# Patient Record
Sex: Female | Born: 1985 | Race: White | Hispanic: No | Marital: Married | Smoking: Never smoker
Health system: Southern US, Community
[De-identification: ages and names within clinical notes are randomized; demographics above are authoritative.]

## PROBLEM LIST (undated history)

## (undated) ENCOUNTER — Inpatient Hospital Stay (HOSPITAL_COMMUNITY): Payer: Self-pay

## (undated) DIAGNOSIS — M1990 Unspecified osteoarthritis, unspecified site: Secondary | ICD-10-CM

## (undated) DIAGNOSIS — M419 Scoliosis, unspecified: Secondary | ICD-10-CM

## (undated) DIAGNOSIS — IMO0001 Reserved for inherently not codable concepts without codable children: Secondary | ICD-10-CM

## (undated) DIAGNOSIS — M4050 Lordosis, unspecified, site unspecified: Secondary | ICD-10-CM

## (undated) DIAGNOSIS — L709 Acne, unspecified: Secondary | ICD-10-CM

## (undated) HISTORY — DX: Scoliosis, unspecified: M41.9

## (undated) HISTORY — DX: Acne, unspecified: L70.9

## (undated) HISTORY — DX: Lordosis, unspecified, site unspecified: M40.50

## (undated) HISTORY — DX: Reserved for inherently not codable concepts without codable children: IMO0001

## (undated) HISTORY — PX: WISDOM TOOTH EXTRACTION: SHX21

---

## 2012-11-07 ENCOUNTER — Telehealth: Payer: Self-pay | Admitting: Physician Assistant

## 2012-11-07 DIAGNOSIS — Z789 Other specified health status: Secondary | ICD-10-CM

## 2012-11-07 DIAGNOSIS — L709 Acne, unspecified: Secondary | ICD-10-CM

## 2012-11-07 MED ORDER — NORGESTIMATE-ETH ESTRADIOL 0.25-35 MG-MCG PO TABS: 1.0000 | ORAL_TABLET | Freq: Every day | ORAL | Status: DC

## 2012-11-07 MED ORDER — MINOCYCLINE HCL 100 MG PO CAPS: 100.0000 mg | ORAL_CAPSULE | Freq: Two times a day (BID) | ORAL | Status: DC

## 2012-11-07 NOTE — Telephone Encounter (Signed)
Uses for acne.  Last refill 09/22/12  Minocycline 100mg  bid last ov for this 07/11/12 Sprintec birth control last refill 09/17/12  Last CPE w/pap 07/01/11 Minocycline refilled with additional refills Spintec refill for 2 months only.  Pt need to schedule CPE.  Pt called and appt made 11/30/12

## 2012-11-30 ENCOUNTER — Encounter: Payer: Self-pay | Admitting: Physician Assistant

## 2012-11-30 ENCOUNTER — Ambulatory Visit (INDEPENDENT_AMBULATORY_CARE_PROVIDER_SITE_OTHER): Payer: BC Managed Care – PPO | Admitting: Physician Assistant

## 2012-11-30 VITALS — BP 104/66 | HR 72 | Temp 98.8°F | Resp 18 | Ht 64.0 in | Wt 141.0 lb

## 2012-11-30 DIAGNOSIS — M4050 Lordosis, unspecified, site unspecified: Secondary | ICD-10-CM | POA: Insufficient documentation

## 2012-11-30 DIAGNOSIS — M419 Scoliosis, unspecified: Secondary | ICD-10-CM | POA: Insufficient documentation

## 2012-11-30 DIAGNOSIS — L708 Other acne: Secondary | ICD-10-CM

## 2012-11-30 DIAGNOSIS — Z0000 Encounter for general adult medical examination without abnormal findings: Secondary | ICD-10-CM

## 2012-11-30 DIAGNOSIS — Z789 Other specified health status: Secondary | ICD-10-CM

## 2012-11-30 DIAGNOSIS — L709 Acne, unspecified: Secondary | ICD-10-CM

## 2012-11-30 DIAGNOSIS — IMO0001 Reserved for inherently not codable concepts without codable children: Secondary | ICD-10-CM

## 2012-11-30 DIAGNOSIS — B373 Candidiasis of vulva and vagina: Secondary | ICD-10-CM

## 2012-11-30 DIAGNOSIS — Z309 Encounter for contraceptive management, unspecified: Secondary | ICD-10-CM

## 2012-11-30 MED ORDER — NORGESTIMATE-ETH ESTRADIOL 0.25-35 MG-MCG PO TABS: 1.0000 | ORAL_TABLET | Freq: Every day | ORAL | Status: DC

## 2012-11-30 MED ORDER — FLUCONAZOLE 150 MG PO TABS: 150.0000 mg | ORAL_TABLET | Freq: Once | ORAL | Status: DC

## 2012-11-30 MED ORDER — MINOCYCLINE HCL 100 MG PO CAPS: 100.0000 mg | ORAL_CAPSULE | Freq: Two times a day (BID) | ORAL | Status: DC
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
## 2013-01-20 ENCOUNTER — Ambulatory Visit
Admission: RE | Admit: 2013-01-20 | Discharge: 2013-01-20 | Disposition: A | Discharge status: Home / Self Care | Payer: BC Managed Care – PPO | Source: Ambulatory Visit | Attending: Physician Assistant | Admitting: Physician Assistant

## 2013-01-20 DIAGNOSIS — Z0000 Encounter for general adult medical examination without abnormal findings: Secondary | ICD-10-CM

## 2013-03-06 ENCOUNTER — Telehealth: Payer: Self-pay | Admitting: Physician Assistant

## 2013-03-06 NOTE — Telephone Encounter (Signed)
Spoke to patient about OCT and antibiotics.  There is a risk of OCT being less effective when on antibiotics.  What the percentages are variable between each individual patient and the meds they are on.  Would suggest to be safe use another form of protection in conjunction with medications.  Offered topical acne treatment per provider recommendation.  She want to discuss with husband and she will let us know.

## 2013-03-06 NOTE — Telephone Encounter (Signed)
Pt is on sprintec and minocycline. When she went to get them at the pharmacy the other day they told her that they interact with each other. They said that the minocycline can make the sprintec useless. She wants to know if this is true. If so, can she get another acne medication. She also wants to know that if this is true could she have fertility problems because she has been married since April and has not gotten pregnant since then.

## 2013-03-06 NOTE — Telephone Encounter (Signed)
Antibiotics can decrease the effectiess of OCT but she should not be concerned about infertility problems just because she did not get pregnant while taking both OCT and Abx.  For the acne, can stop the minocycline and use topical med only. Rx BenzaClinGel Apply QHS 30 gram/2 Refill

## 2013-11-15 ENCOUNTER — Telehealth: Payer: Self-pay | Admitting: Physician Assistant

## 2013-11-15 ENCOUNTER — Encounter: Payer: Self-pay | Admitting: Family Medicine

## 2013-11-15 DIAGNOSIS — Z789 Other specified health status: Secondary | ICD-10-CM

## 2013-11-15 MED ORDER — NORGESTIMATE-ETH ESTRADIOL 0.25-35 MG-MCG PO TABS: 1.0000 | ORAL_TABLET | Freq: Every day | ORAL | Status: DC

## 2013-11-15 NOTE — Telephone Encounter (Signed)
Medication refill for one time only.  Patient needs to be seen.  Letter sent for patient to call and schedule 

## 2013-11-15 NOTE — Telephone Encounter (Signed)
[phone] Pharmacy [facility] [address] [place] Pt is needing a refill on her birth control could not understand name of medication

## 2013-11-28 ENCOUNTER — Other Ambulatory Visit: Payer: Self-pay | Admitting: Physician Assistant

## 2013-11-28 ENCOUNTER — Encounter: Payer: Self-pay | Admitting: Family Medicine

## 2013-11-28 DIAGNOSIS — Z309 Encounter for contraceptive management, unspecified: Secondary | ICD-10-CM

## 2013-11-28 NOTE — Telephone Encounter (Signed)
Medication refill for one time only.  Patient needs to be seen.  Letter sent for patient to call and schedule 

## 2013-12-01 ENCOUNTER — Other Ambulatory Visit: Payer: Self-pay | Admitting: Physician Assistant

## 2013-12-01 NOTE — Telephone Encounter (Signed)
Medication filled x1 with no refills.   Requires office visit before any further refills can be given.  

## 2013-12-21 ENCOUNTER — Other Ambulatory Visit: Payer: Self-pay

## 2013-12-21 ENCOUNTER — Other Ambulatory Visit: Payer: Self-pay | Admitting: Adult Health Nurse Practitioner

## 2013-12-21 DIAGNOSIS — Z1231 Encounter for screening mammogram for malignant neoplasm of breast: Secondary | ICD-10-CM

## 2013-12-21 DIAGNOSIS — Z803 Family history of malignant neoplasm of breast: Secondary | ICD-10-CM

## 2014-01-01 ENCOUNTER — Other Ambulatory Visit: Payer: Self-pay | Admitting: Physician Assistant

## 2014-01-01 ENCOUNTER — Encounter: Payer: Self-pay | Admitting: Family Medicine

## 2014-01-01 NOTE — Telephone Encounter (Signed)
Medication refill for one time only.  Patient needs to be seen.  Letter sent for patient to call and schedule 

## 2014-01-08 ENCOUNTER — Other Ambulatory Visit: Payer: Self-pay | Admitting: Physician Assistant

## 2014-01-08 NOTE — Telephone Encounter (Signed)
Medication filled x1 with no refills.   Requires office visit before any further refills can be given.   Letter sent.  

## 2014-01-23 ENCOUNTER — Ambulatory Visit
Admission: RE | Admit: 2014-01-23 | Discharge: 2014-01-23 | Disposition: A | Discharge status: Home / Self Care | Payer: BC Managed Care – PPO | Source: Ambulatory Visit | Attending: Adult Health Nurse Practitioner | Admitting: Adult Health Nurse Practitioner

## 2014-01-23 ENCOUNTER — Encounter (INDEPENDENT_AMBULATORY_CARE_PROVIDER_SITE_OTHER): Payer: Self-pay

## 2014-01-23 DIAGNOSIS — Z1231 Encounter for screening mammogram for malignant neoplasm of breast: Secondary | ICD-10-CM

## 2014-01-23 DIAGNOSIS — Z803 Family history of malignant neoplasm of breast: Secondary | ICD-10-CM

## 2014-01-24 ENCOUNTER — Other Ambulatory Visit: Payer: Self-pay | Admitting: Physician Assistant

## 2014-01-24 NOTE — Telephone Encounter (Signed)
Medication filled x1 with no refills.   Requires office visit before any further refills can be given.   Letter sent.  

## 2015-05-06 ENCOUNTER — Other Ambulatory Visit: Payer: Self-pay

## 2015-05-06 DIAGNOSIS — Z1231 Encounter for screening mammogram for malignant neoplasm of breast: Secondary | ICD-10-CM

## 2015-05-14 ENCOUNTER — Telehealth (HOSPITAL_COMMUNITY): Payer: Self-pay | Admitting: *Deleted

## 2015-06-17 ENCOUNTER — Ambulatory Visit: Payer: Self-pay

## 2015-06-27 ENCOUNTER — Ambulatory Visit: Payer: Self-pay

## 2015-07-17 ENCOUNTER — Ambulatory Visit
Admission: RE | Admit: 2015-07-17 | Discharge: 2015-07-17 | Disposition: A | Discharge status: Home / Self Care | Payer: BLUE CROSS/BLUE SHIELD | Source: Ambulatory Visit

## 2015-07-17 DIAGNOSIS — Z1231 Encounter for screening mammogram for malignant neoplasm of breast: Secondary | ICD-10-CM

## 2016-01-06 ENCOUNTER — Inpatient Hospital Stay (HOSPITAL_COMMUNITY)
Admission: AD | Admit: 2016-01-06 | Discharge: 2016-01-06 | Disposition: A | Discharge status: Home / Self Care | Payer: BLUE CROSS/BLUE SHIELD | Source: Ambulatory Visit | Attending: Obstetrics and Gynecology | Admitting: Obstetrics and Gynecology

## 2016-01-06 ENCOUNTER — Inpatient Hospital Stay (HOSPITAL_COMMUNITY): Payer: BLUE CROSS/BLUE SHIELD

## 2016-01-06 ENCOUNTER — Encounter (HOSPITAL_COMMUNITY): Payer: Self-pay | Admitting: Student

## 2016-01-06 DIAGNOSIS — O200 Threatened abortion: Secondary | ICD-10-CM | POA: Insufficient documentation

## 2016-01-06 DIAGNOSIS — O209 Hemorrhage in early pregnancy, unspecified: Secondary | ICD-10-CM

## 2016-01-06 DIAGNOSIS — Z882 Allergy status to sulfonamides status: Secondary | ICD-10-CM | POA: Insufficient documentation

## 2016-01-06 DIAGNOSIS — Z3A00 Weeks of gestation of pregnancy not specified: Secondary | ICD-10-CM | POA: Diagnosis not present

## 2016-01-06 DIAGNOSIS — M419 Scoliosis, unspecified: Secondary | ICD-10-CM | POA: Diagnosis not present

## 2016-01-06 DIAGNOSIS — O3680X0 Pregnancy with inconclusive fetal viability, not applicable or unspecified: Secondary | ICD-10-CM

## 2016-01-06 HISTORY — DX: Unspecified osteoarthritis, unspecified site: M19.90

## 2016-01-06 LAB — URINE MICROSCOPIC-ADD ON: WBC UA: NONE SEEN WBC/hpf (ref 0–5)

## 2016-01-06 LAB — WET PREP, GENITAL
Clue Cells Wet Prep HPF POC: NONE SEEN
Sperm: NONE SEEN
Trich, Wet Prep: NONE SEEN
Yeast Wet Prep HPF POC: NONE SEEN

## 2016-01-06 LAB — URINALYSIS, ROUTINE W REFLEX MICROSCOPIC
Bilirubin Urine: NEGATIVE
GLUCOSE, UA: NEGATIVE mg/dL
KETONES UR: NEGATIVE mg/dL
LEUKOCYTES UA: NEGATIVE
Nitrite: NEGATIVE
PROTEIN: NEGATIVE mg/dL
Specific Gravity, Urine: <1.005 — ABNORMAL LOW (ref 1.005–1.030)
pH: 6 (ref 5.0–8.0)

## 2016-01-06 LAB — ABO/RH: ABO/RH(D): A POS

## 2016-01-06 LAB — CBC
HCT: 40.1 % (ref 36.0–46.0)
Hemoglobin: 13.9 g/dL (ref 12.0–15.0)
MCH: 30 pg (ref 26.0–34.0)
MCHC: 34.7 g/dL (ref 30.0–36.0)
MCV: 86.6 fL (ref 78.0–100.0)
PLATELETS: 241 10*3/uL (ref 150–400)
RBC: 4.63 MIL/uL (ref 3.87–5.11)
RDW: 13.4 % (ref 11.5–15.5)
WBC: 9.6 10*3/uL (ref 4.0–10.5)

## 2016-01-06 LAB — HCG, QUANTITATIVE, PREGNANCY: hCG, Beta Chain, Quant, S: 228 m[IU]/mL — ABNORMAL HIGH (ref <5)

## 2016-01-06 LAB — POCT PREGNANCY, URINE: Preg Test, Ur: POSITIVE — AB

## 2016-01-06 NOTE — MAU Note (Signed)
Pt states she spotted on Saturday, was ok on Sunday.  Began bleeding today before noon, has become heavier.  Passed a clot of ? Tissue around 1730.  Bleeding has been less since then.  Has also had lower abd cramping.  Pos HPT on June 4.

## 2016-01-06 NOTE — MAU Provider Note (Signed)
History     CSN: [id number]  Arrival date and time: 01/06/16 [id number]   First Provider Initiated Contact with Patient 01/06/16 2013      Chief Complaint  Patient presents with  . Vaginal Bleeding  . Abdominal Pain   Vaginal Bleeding The patient's primary symptoms include pelvic pain and vaginal bleeding. This is a new problem. The current episode started yesterday. The problem occurs intermittently. The problem has been gradually worsening. Pain severity now: 4/10  The problem affects both sides. She is pregnant. Associated symptoms include abdominal pain. Pertinent negatives include no chills, constipation, diarrhea, dysuria, fever, frequency, nausea, urgency or vomiting. The vaginal bleeding is typical of menses. She has been passing clots (one golf ball sized clot. ). She has been passing tissue. Nothing aggravates the symptoms. She has tried nothing for the symptoms. Menstrual history: LMP 11/17/15     Past Medical History  Diagnosis Date  . Acne   . Contraception   . Scoliosis   . Lordosis   . Arthritis     Past Surgical History  Procedure Laterality Date  . Wisdom tooth extraction      Family History  Problem Relation Age of Onset  . Cancer Mother 2638    breast cancer  . Cancer Father 6160    stomach,liver cancer  . Heart disease Father 250    CAD    Social History  Substance Use Topics  . Smoking status: Never Smoker   . Smokeless tobacco: Never Used  . Alcohol Use: No    Allergies:  Allergies  Allergen Reactions  . Sulfa Antibiotics Rash    Prescriptions prior to admission  Medication Sig Dispense Refill Last Dose  . Azelaic Acid (FINACEA EX) Apply 1 application topically 2 (two) times daily.   01/06/2016 at Unknown time  . Prenatal Vit-Fe Fumarate-FA (PRENATAL MULTIVITAMIN) TABS tablet Take 1 tablet by mouth daily at 12 noon.   01/06/2016 at Unknown time  . fluconazole (DIFLUCAN) 150 MG tablet Take 1 tablet (150 mg total) by mouth once. (Patient not taking:  Reported on 01/06/2016) 1 tablet 0   . minocycline (MINOCIN,DYNACIN) 100 MG capsule TAKE 1 CAPSULE (100 MG TOTAL) BY MOUTH 2 (TWO) TIMES DAILY. (Patient not taking: Reported on 01/06/2016) 60 capsule 0   . SPRINTEC 28 0.25-35 MG-MCG tablet TAKE 1 TABLET BY MOUTH DAILY. (Patient not taking: Reported on 01/06/2016) 28 tablet 0     Review of Systems  Constitutional: Negative for fever and chills.  Gastrointestinal: Positive for abdominal pain. Negative for nausea, vomiting, diarrhea and constipation.  Genitourinary: Positive for vaginal bleeding and pelvic pain. Negative for dysuria, urgency and frequency.   Physical Exam   Blood pressure 124/81, pulse 87, temperature 98.8 F (37.1 C), temperature source Oral, resp. rate 16, height 5\' 4"  (1.626 m), weight 65.318 kg (144 lb), last menstrual period 11/17/2015.  Physical Exam  Nursing note and vitals reviewed. Constitutional: She is oriented to person, place, and time. She appears well-developed and well-nourished. No distress.  HENT:  Head: Normocephalic.  Cardiovascular: Normal rate.   Respiratory: Effort normal.  GI: Soft. There is no tenderness. There is no rebound.  Genitourinary:   External: no lesion Vagina: small amount of dark red blood  Cervix: pink, smooth Uterus: NSSC Adnexa: NT   Neurological: She is alert and oriented to person, place, and time.  Skin: Skin is warm and dry.  Psychiatric: She has a normal mood and affect.     Results for orders placed or  performed during the hospital encounter of 01/06/16 (from the past 24 hour(s))  Urinalysis, Routine w reflex microscopic (not at [facility])     Status: Abnormal   Collection Time: 01/06/16  6:28 PM  Result Value Ref Range   Color, Urine YELLOW YELLOW   APPearance CLEAR CLEAR   Specific Gravity, Urine <1.005 (L) 1.005 - 1.030   pH 6.0 5.0 - 8.0   Glucose, UA NEGATIVE NEGATIVE mg/dL   Hgb urine dipstick LARGE (A) NEGATIVE   Bilirubin Urine NEGATIVE NEGATIVE   Ketones, ur  NEGATIVE NEGATIVE mg/dL   Protein, ur NEGATIVE NEGATIVE mg/dL   Nitrite NEGATIVE NEGATIVE   Leukocytes, UA NEGATIVE NEGATIVE  Urine microscopic-add on     Status: Abnormal   Collection Time: 01/06/16  6:28 PM  Result Value Ref Range   Squamous Epithelial / LPF 0-5 (A) NONE SEEN   WBC, UA NONE SEEN 0 - 5 WBC/hpf   RBC / HPF 6-30 0 - 5 RBC/hpf   Bacteria, UA RARE (A) NONE SEEN  Pregnancy, urine POC     Status: Abnormal   Collection Time: 01/06/16  6:35 PM  Result Value Ref Range   Preg Test, Ur POSITIVE (A) NEGATIVE  CBC     Status: None   Collection Time: 01/06/16  7:17 PM  Result Value Ref Range   WBC 9.6 4.0 - 10.5 K/uL   RBC 4.63 3.87 - 5.11 MIL/uL   Hemoglobin 13.9 12.0 - 15.0 g/dL   HCT 74.040.1 81.436.0 - 48.146.0 %   MCV 86.6 78.0 - 100.0 fL   MCH 30.0 26.0 - 34.0 pg   MCHC 34.7 30.0 - 36.0 g/dL   RDW 85.613.4 31.411.5 - 97.015.5 %   Platelets 241 150 - 400 K/uL  ABO/Rh     Status: None (Preliminary result)   Collection Time: 01/06/16  7:18 PM  Result Value Ref Range   ABO/RH(D) A POS   hCG, quantitative, pregnancy     Status: Abnormal   Collection Time: 01/06/16  7:18 PM  Result Value Ref Range   hCG, Beta Chain, Quant, S 228 (H) <5 mIU/mL  Wet prep, genital     Status: Abnormal   Collection Time: 01/06/16  8:20 PM  Result Value Ref Range   Yeast Wet Prep HPF POC NONE SEEN NONE SEEN   Trich, Wet Prep NONE SEEN NONE SEEN   Clue Cells Wet Prep HPF POC NONE SEEN NONE SEEN   WBC, Wet Prep HPF POC FEW (A) NONE SEEN   Sperm NONE SEEN    [place] Ob Comp Less 14 Wks  01/06/2016  CLINICAL DATA:  30 year old female with positive HCG levels and vaginal bleeding EXAM: OBSTETRIC <14 WK US AND TRANSVAGINAL OB US TECHNIQUE: Both transabdominal and transvaginal ultrasound examinations were performed for complete evaluation of the gestation as well as the maternal uterus, adnexal regions, and pelvic cul-de-sac. Transvaginal technique was performed to assess early pregnancy. COMPARISON:  None. FINDINGS: The  uterus is anteverted and appears homogeneous. The endometrium measures 6 mm in thickness. No intrauterine pregnancy identified. Findings may represent an early pregnancy with too small gestational sac to visualize, spontaneous miscarriage. The possibility of an ectopic pregnancy is not excluded. The right ovary measures 3.0 x 2.7 x 2.7 cm. There is a 2.0 x 2.3 x 2.1 cm cyst with a septation in the right ovary. The left ovary appears unremarkable and measures 2.2 x 1.4 x 1.1 cm. A corpus luteum noted in the left ovary. No free fluid within  pelvis. IMPRESSION: No intrauterine pregnancy identified. Please note with a positive HCG level and in the absence of documented intrauterine pregnancy the possibility of an ectopic pregnancy is not entirely excluded. Correlation with serial HCG levels and follow-up with ultrasound recommended. Electronically Signed   By: Elgie Collard M.D.   On: 01/06/2016 21:01   US Ob Transvaginal  01/06/2016  CLINICAL DATA:  30 year old female with positive HCG levels and vaginal bleeding EXAM: OBSTETRIC <14 WK [place] AND TRANSVAGINAL OB US TECHNIQUE: Both transabdominal and transvaginal ultrasound examinations were performed for complete evaluation of the gestation as well as the maternal uterus, adnexal regions, and pelvic cul-de-sac. Transvaginal technique was performed to assess early pregnancy. COMPARISON:  None. FINDINGS: The uterus is anteverted and appears homogeneous. The endometrium measures 6 mm in thickness. No intrauterine pregnancy identified. Findings may represent an early pregnancy with too small gestational sac to visualize, spontaneous miscarriage. The possibility of an ectopic pregnancy is not excluded. The right ovary measures 3.0 x 2.7 x 2.7 cm. There is a 2.0 x 2.3 x 2.1 cm cyst with a septation in the right ovary. The left ovary appears unremarkable and measures 2.2 x 1.4 x 1.1 cm. A corpus luteum noted in the left ovary. No free fluid within pelvis. IMPRESSION: No  intrauterine pregnancy identified. Please note with a positive HCG level and in the absence of documented intrauterine pregnancy the possibility of an ectopic pregnancy is not entirely excluded. Correlation with serial HCG levels and follow-up with ultrasound recommended. Electronically Signed   By: Elgie Collard M.D.   On: 01/06/2016 21:01    MAU Course  Procedures  MDM   Assessment and Plan   1. Threatened abortion in first trimester   2. Vaginal bleeding in pregnancy, first trimester   3. Pregnancy of unknown anatomic location    DC home Comfort measures reviewed  1st Trimester precautions  Bleeding precautions Ectopic precautions RX: none  Return to MAU as needed FU with OB as planned  Follow-up Information    Follow up with [facility].   Specialty:  Obstetrics and Gynecology   Why:  Thursday morning between 8-11 am    Contact information:   [address] [place] [address] [phone]        Tawnya Crook 01/06/2016, 8:15 PM

## 2016-01-06 NOTE — Discharge Instructions (Signed)

## 2016-01-07 LAB — GC/CHLAMYDIA PROBE AMP (~~LOC~~) NOT AT ARMC
Chlamydia: NEGATIVE
Neisseria Gonorrhea: NEGATIVE

## 2016-01-07 LAB — HIV ANTIBODY (ROUTINE TESTING W REFLEX): HIV Screen 4th Generation wRfx: NONREACTIVE

## 2016-01-08 ENCOUNTER — Telehealth: Payer: Self-pay | Admitting: *Deleted

## 2016-01-08 DIAGNOSIS — O039 Complete or unspecified spontaneous abortion without complication: Secondary | ICD-10-CM

## 2016-01-08 NOTE — Telephone Encounter (Signed)
Pt called because she has had an SAB and was [facility] and was to [facility] on Thursday for Qhcg.  She has been called out of town for a funeral and will not be back until Friday.  Lab requisition was taken to lab so that pt could go there around 3:00 Friday.  Results will be called to pt on Monday.

## 2016-01-11 LAB — HCG, QUANTITATIVE, PREGNANCY: HCG, BETA CHAIN, QUANT, S: 22.3 m[IU]/mL — AB

## 2016-01-15 ENCOUNTER — Other Ambulatory Visit (INDEPENDENT_AMBULATORY_CARE_PROVIDER_SITE_OTHER): Payer: BLUE CROSS/BLUE SHIELD

## 2016-01-15 DIAGNOSIS — O039 Complete or unspecified spontaneous abortion without complication: Secondary | ICD-10-CM | POA: Diagnosis not present

## 2016-01-16 ENCOUNTER — Telehealth: Payer: Self-pay | Admitting: *Deleted

## 2016-01-16 LAB — HCG, QUANTITATIVE, PREGNANCY: hCG, Beta Chain, Quant, S: 5.5 m[IU]/mL — ABNORMAL HIGH

## 2016-01-16 NOTE — Telephone Encounter (Signed)
-----   Message from Tereso NewcomerUgonna A Anyanwu, MD sent at 01/16/2016  9:29 AM EDT ----- Completed SAB, no further labs needed. Can make an appointment with any provider to discuss future pregnancy plans, contraception etc.  Please call to inform patient of results and recommendations.

## 2016-01-16 NOTE — Telephone Encounter (Signed)
Pt notified of BHCG results, she will call for a f/u appt if not pregnant in 3-4 months.

## 2016-01-17 ENCOUNTER — Encounter: Payer: BLUE CROSS/BLUE SHIELD | Admitting: Family

## 2016-03-14 ENCOUNTER — Inpatient Hospital Stay (HOSPITAL_COMMUNITY)
Admission: AD | Admit: 2016-03-14 | Payer: BC Managed Care – PPO | Source: Ambulatory Visit | Admitting: Obstetrics & Gynecology

## 2016-04-20 ENCOUNTER — Encounter: Payer: Self-pay | Admitting: Obstetrics & Gynecology

## 2016-04-20 ENCOUNTER — Ambulatory Visit (INDEPENDENT_AMBULATORY_CARE_PROVIDER_SITE_OTHER): Payer: BC Managed Care – PPO | Admitting: Obstetrics & Gynecology

## 2016-04-20 VITALS — Ht 65.0 in | Wt 142.0 lb

## 2016-04-20 DIAGNOSIS — Z124 Encounter for screening for malignant neoplasm of cervix: Secondary | ICD-10-CM | POA: Diagnosis not present

## 2016-04-20 DIAGNOSIS — Z1151 Encounter for screening for human papillomavirus (HPV): Secondary | ICD-10-CM | POA: Diagnosis not present

## 2016-04-20 DIAGNOSIS — Z803 Family history of malignant neoplasm of breast: Secondary | ICD-10-CM | POA: Insufficient documentation

## 2016-04-20 DIAGNOSIS — Z01419 Encounter for gynecological examination (general) (routine) without abnormal findings: Secondary | ICD-10-CM

## 2016-04-20 NOTE — Progress Notes (Signed)
Subjective:     Emily Mclaughlin is a 30 y.o. female here for a routine exam.  Current complaints: infertillity.  Pt has been trying for over a year and had one pregnancy that ended in miscarriage (see notes in MAU).  Never had a viable IUP.  Possible miscarried at home before coming to ED.  Has timed intercourse.  Uses ovulation prediction kits and ovulates.  Mensdes has become lighter--lsating 2 days.  Menstrual cycles are 26-28 days. .   Gynecologic History Patient's last menstrual period was 03/29/2016. Contraception: none Last Pap: ?2015. Results were: normal Last mammogram: uknown  Obstetric History OB History  Gravida Para Term Preterm AB Living  1            SAB TAB Ectopic Multiple Live Births               # Outcome Date GA Lbr Len/2nd Weight Sex Delivery Anes PTL Lv  1 Gravida                The following portions of the patient's history were reviewed and updated as appropriate: allergies, current medications, past family history, past medical history, past social history, past surgical history and problem list.  Review of Systems Pertinent items noted in HPI and remainder of comprehensive ROS otherwise negative.    Objective:      Vitals:   04/20/16 1340  Weight: 142 lb (64.4 kg)  Height: 5\' 5"  (1.651 m)   Vitals:  WNL General appearance: alert, cooperative and no distress  HEENT: Normocephalic, without obvious abnormality, atraumatic Eyes: negative Throat: lips, mucosa, and tongue normal; teeth and gums normal  Respiratory: Clear to auscultation bilaterally  CV: Regular rate and rhythm  Breasts:  Normal appearance, no masses or tenderness, no nipple retraction or dimpling  GI: Soft, non-tender; bowel sounds normal; no masses,  no organomegaly  GU: External Genitalia:  Tanner V, no lesion Urethra:  No prolapse   Vagina: Pink, normal rugae, no blood or discharge  Cervix: No CMT, no lesion  Uterus:  Normal size and contour, non tender  Adnexa: Normal,  no masses, non tender  Musculoskeletal: No edema, redness or tenderness in the calves or thighs  Skin: No lesions or rash  Lymphatic: Axillary adenopathy: none    Psychiatric: Normal mood and behavior        Assessment:    Healthy female exam.   Family history of breast cancer--mother age 32 Father died of stomach cancer   Plan:   Health maintenance--Pap with cotesting Family history of breast cancer--Recommend genetic testing (consult ordered today).   Mammogram Jan 2017--nml Birrads 1 Indertility--semen analysis; If abnml then refer to Dr. Jeannie FendY.  If nml then consider Femara or HSG.  Will have patient come back for further discussion TSH, prolactin

## 2016-04-21 LAB — TSH: TSH: 3.26 mIU/L

## 2016-04-21 LAB — PROLACTIN: PROLACTIN: 21 ng/mL

## 2016-04-23 ENCOUNTER — Encounter: Payer: Self-pay | Admitting: Obstetrics & Gynecology

## 2016-04-23 LAB — CYTOLOGY - PAP

## 2016-04-29 ENCOUNTER — Encounter: Payer: Self-pay | Admitting: *Deleted

## 2016-05-05 ENCOUNTER — Encounter: Payer: Self-pay | Admitting: Obstetrics & Gynecology

## 2016-05-11 ENCOUNTER — Ambulatory Visit (INDEPENDENT_AMBULATORY_CARE_PROVIDER_SITE_OTHER): Payer: BC Managed Care – PPO

## 2016-05-11 ENCOUNTER — Telehealth: Payer: Self-pay | Admitting: *Deleted

## 2016-05-11 DIAGNOSIS — N83209 Unspecified ovarian cyst, unspecified side: Secondary | ICD-10-CM

## 2016-05-11 DIAGNOSIS — N83201 Unspecified ovarian cyst, right side: Secondary | ICD-10-CM | POA: Diagnosis not present

## 2016-05-11 NOTE — Telephone Encounter (Signed)
Discussed SA with patient and recommended spouse stay on Vitamins until pregnancy achieved or may need recpeat SA in 6 months.  TVU scheduled for F/U  Ovarian cyst and will have Dr Penne LashLeggett review and speak with pt on Wednesday.

## 2016-05-13 ENCOUNTER — Encounter: Payer: Self-pay | Admitting: Obstetrics & Gynecology

## 2016-05-22 ENCOUNTER — Encounter: Payer: Self-pay | Admitting: Obstetrics & Gynecology

## 2016-07-13 NOTE — L&D Delivery Note (Signed)
Operative Delivery Note At 7:41 AM a viable female was delivered via Vaginal, Vacuum Investment banker, operational(Extractor).  Presentation: vertex; Position: Left,, Occiput,, Anterior; Station: +3.  Verbal consent: obtained from patient.  Risks and benefits discussed in detail.  Risks include, but are not limited to the risks of anesthesia, bleeding, infection, damage to maternal tissues, fetal cephalhematoma.  There is also the risk of inability to effect vaginal delivery of the head, or shoulder dystocia that cannot be resolved by established maneuvers, leading to the need for emergency cesarean section.  APGAR: 9, 9; weight  .   Placenta status:spont , shultz.   Cord:3vc  with the following complications: none.  Cord pH: n/a  Anesthesia:  Nitrous, local Instruments: kiwi Episiotomy: None Lacerations: 3rd degree Suture Repair: repair in usual fashion per Dr. Macon LargeAnyanwu Est. Blood Loss 350(mL):    Mom to postpartum.  Baby to Couplet care / Skin to Skin.  Emily Mclaughlin 03/15/2017, 8:06 AM

## 2016-08-04 ENCOUNTER — Ambulatory Visit (INDEPENDENT_AMBULATORY_CARE_PROVIDER_SITE_OTHER): Payer: BC Managed Care – PPO | Admitting: Obstetrics & Gynecology

## 2016-08-04 ENCOUNTER — Encounter: Payer: Self-pay | Admitting: Obstetrics & Gynecology

## 2016-08-04 DIAGNOSIS — Z113 Encounter for screening for infections with a predominantly sexual mode of transmission: Secondary | ICD-10-CM | POA: Diagnosis not present

## 2016-08-04 DIAGNOSIS — Z3401 Encounter for supervision of normal first pregnancy, first trimester: Secondary | ICD-10-CM

## 2016-08-04 DIAGNOSIS — Z3689 Encounter for other specified antenatal screening: Secondary | ICD-10-CM

## 2016-08-04 DIAGNOSIS — Z23 Encounter for immunization: Secondary | ICD-10-CM

## 2016-08-04 DIAGNOSIS — Z3493 Encounter for supervision of normal pregnancy, unspecified, third trimester: Secondary | ICD-10-CM | POA: Insufficient documentation

## 2016-08-04 DIAGNOSIS — M419 Scoliosis, unspecified: Secondary | ICD-10-CM

## 2016-08-04 NOTE — Progress Notes (Signed)
Bedside U/S shows IUP with FHT of 180 BPM and CRL 14.662mm  GA [id number]

## 2016-08-05 LAB — GLUCOSE, RANDOM: Glucose, Bld: 59 mg/dL — ABNORMAL LOW (ref 65–99)

## 2016-08-05 LAB — HEMOGLOBIN A1C
Hgb A1c MFr Bld: 4.9 % (ref <5.7)
Mean Plasma Glucose: 94 mg/dL

## 2016-08-06 LAB — PRENATAL PROFILE (SOLSTAS)
Antibody Screen: NEGATIVE
BASOS PCT: 0 %
Basophils Absolute: 0 cells/uL (ref 0–200)
EOS ABS: 71 {cells}/uL (ref 15–500)
Eosinophils Relative: 1 %
HCT: 40 % (ref 35.0–45.0)
HEP B S AG: NEGATIVE
HIV: NONREACTIVE
Hemoglobin: 13.2 g/dL (ref 11.7–15.5)
Lymphocytes Relative: 29 %
Lymphs Abs: 2059 cells/uL (ref 850–3900)
MCH: 29.8 pg (ref 27.0–33.0)
MCHC: 33 g/dL (ref 32.0–36.0)
MCV: 90.3 fL (ref 80.0–100.0)
MONO ABS: 568 {cells}/uL (ref 200–950)
MPV: 9.7 fL (ref 7.5–12.5)
Monocytes Relative: 8 %
NEUTROS ABS: 4402 {cells}/uL (ref 1500–7800)
Neutrophils Relative %: 62 %
PLATELETS: 234 10*3/uL (ref 140–400)
RBC: 4.43 MIL/uL (ref 3.80–5.10)
RDW: 13.8 % (ref 11.0–15.0)
Rh Type: POSITIVE
Rubella: 5.73 Index — ABNORMAL HIGH (ref <0.90)
WBC: 7.1 10*3/uL (ref 3.8–10.8)

## 2016-08-06 LAB — PAIN MGMT, PROFILE 6 CONF W/O MM, U
6 Acetylmorphine: NEGATIVE ng/mL (ref <10)
ALCOHOL METABOLITES: NEGATIVE ng/mL (ref <500)
AMPHETAMINES: NEGATIVE ng/mL (ref <500)
BARBITURATES: NEGATIVE ng/mL (ref <300)
BENZODIAZEPINES: NEGATIVE ng/mL (ref <100)
CREATININE: 109.8 mg/dL (ref >=20.0)
Cocaine Metabolite: NEGATIVE ng/mL (ref <150)
METHADONE METABOLITE: NEGATIVE ng/mL (ref <100)
Marijuana Metabolite: NEGATIVE ng/mL (ref <20)
OXIDANT: NEGATIVE ug/mL (ref <200)
Opiates: NEGATIVE ng/mL (ref <100)
Oxycodone: NEGATIVE ng/mL (ref <100)
PH: 7.75 (ref 4.5–9.0)
PHENCYCLIDINE: NEGATIVE ng/mL (ref <25)
Please note:: 0

## 2016-08-06 LAB — GC/CHLAMYDIA PROBE AMP (~~LOC~~) NOT AT ARMC
Chlamydia: NEGATIVE
NEISSERIA GONORRHEA: NEGATIVE

## 2016-08-06 LAB — CULTURE, OB URINE

## 2016-08-06 NOTE — Progress Notes (Signed)
  Subjective:    Ralene PAYNE [place] is a G2P0010 2752w4d being seen today for her first obstetrical visit.  Her obstetrical history is significant for none--low risk. Patient does intend to breast feed. Pregnancy history fully reviewed.  Patient reports mild nausea and heartburn.  Vitals:   08/04/16 1338  BP: 112/73  Pulse: 90  Weight: 145 lb (65.8 kg)    HISTORY: OB History  Gravida Para Term Preterm AB Living  2       1    SAB TAB Ectopic Multiple Live Births  1            # Outcome Date GA Lbr Len/2nd Weight Sex Delivery Anes PTL Lv  2 Current           1 SAB              Past Medical History:  Diagnosis Date  . Acne   . Arthritis   . Contraception   . Lordosis   . Scoliosis    Past Surgical History:  Procedure Laterality Date  . WISDOM TOOTH EXTRACTION     Family History  Problem Relation Age of Onset  . Cancer Mother 5938    breast cancer  . Cancer Father 3160    stomach,liver cancer  . Heart disease Father 5750    CAD     Exam    Uterus:     Pelvic Exam:    Perineum: No Hemorrhoids   Vulva: normal   Vagina:  normal mucosa   pH: n/a   Cervix: no cervical motion tenderness and no lesions   Adnexa: normal adnexa   Bony Pelvis: average  System: Breast:  normal appearance, no masses or tenderness   Skin: normal coloration and turgor, no rashes    Neurologic: oriented, normal mood   Extremities: normal strength, tone, and muscle mass   HEENT sclera clear, anicteric, oropharynx clear, no lesions, neck supple with midline trachea, thyroid without masses and trachea midline   Mouth/Teeth mucous membranes moist, pharynx normal without lesions and dental hygiene good   Neck supple and no masses   Cardiovascular: regular rate and rhythm   Respiratory:  appears well, vitals normal, no respiratory distress, acyanotic, normal RR, chest clear, no wheezing, crepitations, rhonchi, normal symmetric air entry   Abdomen: soft, non-tender; bowel sounds normal; no  masses,  no organomegaly   Urinary: urethral meatus normal      Assessment:    Pregnancy: G2P0010 Patient Active Problem List   Diagnosis Date Noted  . Supervision of normal pregnancy 08/04/2016  . Family history of breast cancer in mother 04/20/2016  . Acne   . Scoliosis   . Lordosis         Plan:     Initial labs drawn. Prenatal vitamins. Problem list reviewed and updated. Genetic Screening discussed First Screen: ordered.  Ultrasound discussed; fetal survey: requested.  Follow up in 4 weeks.  1.  Lordosis/scoliosos--has spine MD; suggest get note to scan into chart that epidural is fine in labor (avoid confusion at time of delivery).  2.  First screen and AFP 3.  [employer] APP only--does not want reduced visits.  Likes to visit the MD.  Elsie LincolnKelly Jaelah Hauth 08/06/2016

## 2016-09-02 ENCOUNTER — Ambulatory Visit (INDEPENDENT_AMBULATORY_CARE_PROVIDER_SITE_OTHER): Payer: BC Managed Care – PPO | Admitting: Obstetrics & Gynecology

## 2016-09-02 DIAGNOSIS — Z3401 Encounter for supervision of normal first pregnancy, first trimester: Secondary | ICD-10-CM

## 2016-09-04 NOTE — Progress Notes (Signed)
   PRENATAL VISIT NOTE  Subjective:  Emily Mclaughlin is a 31 y.o. G2P0010 at 4316w5d being seen today for ongoing prenatal care.  She is currently monitored for the following issues for this low-risk pregnancy and has Acne; Scoliosis; Lordosis; Family history of breast cancer in mother; and Supervision of normal pregnancy on her problem list.  Patient reports no complaints.   . Vag. Bleeding: None.   . Denies leaking of fluid.   The following portions of the patient's history were reviewed and updated as appropriate: allergies, current medications, past family history, past medical history, past social history, past surgical history and problem list. Problem list updated.  Objective:   Vitals:   09/02/16 1041  BP: 113/79  Pulse: (!) 103  Weight: 141 lb (64 kg)    Fetal Status: Fetal Heart Rate (bpm): 166         General:  Alert, oriented and cooperative. Patient is in no acute distress.  Skin: Skin is warm and dry. No rash noted.   Cardiovascular: Normal heart rate noted  Respiratory: Normal respiratory effort, no problems with respiration noted  Abdomen: Soft, gravid, appropriate for gestational age.       Pelvic:  Cervical exam deferred        Extremities: Normal range of motion.     Mental Status: Normal mood and affect. Normal behavior. Normal judgment and thought content.   Assessment and Plan:  Pregnancy: G2P0010 at 8816w5d  1-First screen scheduled.  AFP 15-20 weeks. 2-Anatomy at 18-20  There are no diagnoses linked to this encounter. Preterm labor symptoms and general obstetric precautions including but not limited to vaginal bleeding, contractions, leaking of fluid and fetal movement were reviewed in detail with the patient. Please refer to After Visit Summary for other counseling recommendations.  Return in about 4 weeks (around 09/30/2016).   Lesly DukesKelly H Suheily Birks, MD

## 2016-09-09 ENCOUNTER — Ambulatory Visit (HOSPITAL_COMMUNITY)
Admission: RE | Admit: 2016-09-09 | Discharge: 2016-09-09 | Disposition: A | Discharge status: Home / Self Care | Payer: BC Managed Care – PPO | Source: Ambulatory Visit | Attending: Obstetrics & Gynecology | Admitting: Obstetrics & Gynecology

## 2016-09-09 ENCOUNTER — Encounter (HOSPITAL_COMMUNITY): Payer: Self-pay

## 2016-09-09 ENCOUNTER — Other Ambulatory Visit: Payer: Self-pay | Admitting: Obstetrics & Gynecology

## 2016-09-09 DIAGNOSIS — Z3A12 12 weeks gestation of pregnancy: Secondary | ICD-10-CM

## 2016-09-09 DIAGNOSIS — Z3401 Encounter for supervision of normal first pregnancy, first trimester: Secondary | ICD-10-CM

## 2016-09-09 DIAGNOSIS — Z3682 Encounter for antenatal screening for nuchal translucency: Secondary | ICD-10-CM | POA: Diagnosis not present

## 2016-09-14 ENCOUNTER — Other Ambulatory Visit: Payer: Self-pay

## 2016-09-30 ENCOUNTER — Encounter: Payer: BC Managed Care – PPO | Admitting: Obstetrics & Gynecology

## 2016-10-01 ENCOUNTER — Encounter: Payer: BC Managed Care – PPO | Admitting: Obstetrics & Gynecology

## 2016-10-06 ENCOUNTER — Encounter: Payer: Self-pay | Admitting: Obstetrics & Gynecology

## 2016-10-06 ENCOUNTER — Ambulatory Visit (INDEPENDENT_AMBULATORY_CARE_PROVIDER_SITE_OTHER): Payer: BC Managed Care – PPO | Admitting: Obstetrics & Gynecology

## 2016-10-06 ENCOUNTER — Encounter: Payer: BC Managed Care – PPO | Admitting: Obstetrics & Gynecology

## 2016-10-06 VITALS — BP 103/61 | HR 86 | Wt 146.0 lb

## 2016-10-06 DIAGNOSIS — Z3402 Encounter for supervision of normal first pregnancy, second trimester: Secondary | ICD-10-CM | POA: Diagnosis not present

## 2016-10-06 NOTE — Progress Notes (Signed)
   PRENATAL VISIT NOTE  Subjective:  Emily Mclaughlin is a 31 y.o. G2P0010 at 5385w2d being seen today for ongoing prenatal care.  She is currently monitored for the following issues for this low-risk pregnancy and has Acne; Scoliosis; Lordosis; Family history of breast cancer in mother; and Supervision of normal pregnancy on her problem list.  Patient reports had 30 minutes of some sharp vaginal pain a week ago, nothing since.  No pressure, no LOF, no vag discharge, no bleeding..   Lockie Pares. Vag. Bleeding: None.  Movement: Absent. Denies leaking of fluid.   The following portions of the patient's history were reviewed and updated as appropriate: allergies, current medications, past family history, past medical history, past social history, past surgical history and problem list. Problem list updated.  Objective:   Vitals:   10/06/16 1320  BP: 103/61  Pulse: 86  Weight: 146 lb (66.2 kg)    Fetal Status: Fetal Heart Rate (bpm): 162   Movement: Absent     General:  Alert, oriented and cooperative. Patient is in no acute distress.  Skin: Skin is warm and dry. No rash noted.   Cardiovascular: Normal heart rate noted  Respiratory: Normal respiratory effort, no problems with respiration noted  Abdomen: Soft, gravid, appropriate for gestational age. Pain/Pressure: Absent     Pelvic:  Cervical exam deferred        Extremities: Normal range of motion.  Edema: None  Mental Status: Normal mood and affect. Normal behavior. Normal judgment and thought content.   Assessment and Plan:  Pregnancy: G2P0010 at 285w2d  1. Encounter for supervision of normal first pregnancy in second trimester -Anatomy US -Dates changed to match 13 week US at M[facility].  New EDC is 03/14/17 - Alpha fetoprotein, maternal  Preterm labor symptoms and general obstetric precautions including but not limited to vaginal bleeding, contractions, leaking of fluid and fetal movement were reviewed in detail with the patient. Please refer to  After Visit Summary for other counseling recommendations.  Return in about 4 weeks (around 11/03/2016).   Lesly DukesKelly H Jacquetta Polhamus, MD

## 2016-10-07 ENCOUNTER — Encounter (INDEPENDENT_AMBULATORY_CARE_PROVIDER_SITE_OTHER): Payer: Self-pay | Admitting: *Deleted

## 2016-10-09 LAB — ALPHA FETOPROTEIN, MATERNAL
AFP: 39.4 ng/mL
CURR GEST AGE: 17.3 wk
MOM FOR AFP: 0.99
Open Spina bifida: NEGATIVE

## 2016-10-20 ENCOUNTER — Ambulatory Visit (HOSPITAL_COMMUNITY)
Admission: RE | Admit: 2016-10-20 | Discharge: 2016-10-20 | Disposition: A | Discharge status: Home / Self Care | Payer: BC Managed Care – PPO | Source: Ambulatory Visit | Attending: Obstetrics & Gynecology | Admitting: Obstetrics & Gynecology

## 2016-10-20 DIAGNOSIS — Z3689 Encounter for other specified antenatal screening: Secondary | ICD-10-CM | POA: Diagnosis present

## 2016-10-20 DIAGNOSIS — Z3402 Encounter for supervision of normal first pregnancy, second trimester: Secondary | ICD-10-CM | POA: Insufficient documentation

## 2016-10-20 DIAGNOSIS — Z3A19 19 weeks gestation of pregnancy: Secondary | ICD-10-CM | POA: Diagnosis not present

## 2016-11-02 ENCOUNTER — Encounter: Payer: BC Managed Care – PPO | Admitting: Obstetrics & Gynecology

## 2016-11-06 ENCOUNTER — Ambulatory Visit (INDEPENDENT_AMBULATORY_CARE_PROVIDER_SITE_OTHER): Payer: BC Managed Care – PPO | Admitting: Advanced Practice Midwife

## 2016-11-06 ENCOUNTER — Encounter: Payer: Self-pay | Admitting: Advanced Practice Midwife

## 2016-11-06 VITALS — BP 98/60 | HR 84 | Wt 153.0 lb

## 2016-11-06 DIAGNOSIS — Z3482 Encounter for supervision of other normal pregnancy, second trimester: Secondary | ICD-10-CM

## 2016-11-06 DIAGNOSIS — Z3480 Encounter for supervision of other normal pregnancy, unspecified trimester: Secondary | ICD-10-CM

## 2016-11-06 NOTE — Patient Instructions (Signed)

## 2016-11-06 NOTE — Progress Notes (Signed)
   PRENATAL VISIT NOTE  Subjective:  Emily Mclaughlin is a 31 y.o. G2P0010 at [redacted]w[redacted]d being seen today for ongoing prenatal care.  She is currently monitored for the following issues for this low-risk pregnancy and has Acne; Scoliosis; Lordosis; Family history of breast cancer in mother; and Supervision of normal pregnancy on her problem list.  Patient reports no complaints.   . Vag. Bleeding: None.  Movement: Present. Denies leaking of fluid.   The following portions of the patient's history were reviewed and updated as appropriate: allergies, current medications, past family history, past medical history, past social history, past surgical history and problem list. Problem list updated.  Objective:   Vitals:   11/06/16 0908  BP: 98/60  Pulse: 84  Weight: 153 lb (69.4 kg)    Fetal Status: Fetal Heart Rate (bpm): 158   Movement: Present     General:  Alert, oriented and cooperative. Patient is in no acute distress.  Skin: Skin is warm and dry. No rash noted.   Cardiovascular: Normal heart rate noted  Respiratory: Normal respiratory effort, no problems with respiration noted  Abdomen: Soft, gravid, appropriate for gestational age. Pain/Pressure: Absent     Pelvic:  Cervical exam deferred        Extremities: Normal range of motion.  Edema: None  Mental Status: Normal mood and affect. Normal behavior. Normal judgment and thought content.   Assessment and Plan:  Pregnancy: G2P0010 at [redacted]w[redacted]d  1. Supervision of other normal pregnancy, antepartum      Traveling to [place] soon. Discussed travel recommendations, Will get copy of records  - [place] [facility] OB FOLLOW UP; Future  Preterm labor symptoms and general obstetric precautions including but not limited to vaginal bleeding, contractions, leaking of fluid and fetal movement were reviewed in detail with the patient. Please refer to After Visit Summary for other counseling recommendations.  RTO 4 weeks   Aviva Signs, CNM

## 2016-11-06 NOTE — Progress Notes (Signed)
Leaving for [place] for 2 weeks

## 2016-11-12 ENCOUNTER — Encounter: Payer: Self-pay | Admitting: Obstetrics & Gynecology

## 2016-12-08 ENCOUNTER — Ambulatory Visit (HOSPITAL_COMMUNITY)
Admission: RE | Admit: 2016-12-08 | Discharge: 2016-12-08 | Disposition: A | Discharge status: Home / Self Care | Payer: BC Managed Care – PPO | Source: Ambulatory Visit | Attending: Advanced Practice Midwife | Admitting: Advanced Practice Midwife

## 2016-12-08 DIAGNOSIS — Z3482 Encounter for supervision of other normal pregnancy, second trimester: Secondary | ICD-10-CM | POA: Diagnosis not present

## 2016-12-08 DIAGNOSIS — Z3480 Encounter for supervision of other normal pregnancy, unspecified trimester: Secondary | ICD-10-CM

## 2016-12-08 DIAGNOSIS — Z362 Encounter for other antenatal screening follow-up: Secondary | ICD-10-CM | POA: Insufficient documentation

## 2016-12-08 DIAGNOSIS — Z3A26 26 weeks gestation of pregnancy: Secondary | ICD-10-CM | POA: Insufficient documentation

## 2016-12-09 ENCOUNTER — Encounter: Payer: Self-pay | Admitting: Obstetrics & Gynecology

## 2016-12-09 ENCOUNTER — Ambulatory Visit (INDEPENDENT_AMBULATORY_CARE_PROVIDER_SITE_OTHER): Payer: BC Managed Care – PPO | Admitting: Obstetrics & Gynecology

## 2016-12-09 DIAGNOSIS — M419 Scoliosis, unspecified: Secondary | ICD-10-CM

## 2016-12-09 DIAGNOSIS — Z3482 Encounter for supervision of other normal pregnancy, second trimester: Secondary | ICD-10-CM

## 2016-12-09 DIAGNOSIS — Z3402 Encounter for supervision of normal first pregnancy, second trimester: Secondary | ICD-10-CM

## 2016-12-09 NOTE — Progress Notes (Signed)
   PRENATAL VISIT NOTE  Subjective:  Emily Mclaughlin is a 31 y.o. G2P0010 at 1359w3d being seen today for ongoing prenatal care.  She is currently monitored for the following issues for this low-risk pregnancy and has Acne; Scoliosis; Lordosis; Family history of breast cancer in mother; and Supervision of normal pregnancy on her problem list.  Patient reports no complaints.  Contractions: Not present. Vag. Bleeding: None.  Movement: Present. Denies leaking of fluid.   The following portions of the patient's history were reviewed and updated as appropriate: allergies, current medications, past family history, past medical history, past social history, past surgical history and problem list. Problem list updated.  Objective:   Vitals:   12/09/16 1031  BP: (!) 102/59  Pulse: 79  Weight: 157 lb (71.2 kg)    Fetal Status: Fetal Heart Rate (bpm): 151   Movement: Present     General:  Alert, oriented and cooperative. Patient is in no acute distress.  Skin: Skin is warm and dry. No rash noted.   Cardiovascular: Normal heart rate noted  Respiratory: Normal respiratory effort, no problems with respiration noted  Abdomen: Soft, gravid, appropriate for gestational age. Pain/Pressure: Absent     Pelvic:  Cervical exam deferred        Extremities: Normal range of motion.  Edema: Trace  Mental Status: Normal mood and affect. Normal behavior. Normal judgment and thought content.   Assessment and Plan:  Pregnancy: G2P0010 at 6959w3d  1. Encounter for supervision of normal first pregnancy in second trimester -GTT and Tdap next visit  2. Scoliosis, unspecified scoliosis type, unspecified spinal region Cleared for epidural  Preterm labor symptoms and general obstetric precautions including but not limited to vaginal bleeding, contractions, leaking of fluid and fetal movement were reviewed in detail with the patient. Please refer to After Visit Summary for other counseling recommendations.    Return in about 2 weeks (around 12/23/2016).   Elsie LincolnKelly Kanchan Gal, MD

## 2016-12-28 ENCOUNTER — Ambulatory Visit (INDEPENDENT_AMBULATORY_CARE_PROVIDER_SITE_OTHER): Payer: BC Managed Care – PPO | Admitting: Obstetrics & Gynecology

## 2016-12-28 ENCOUNTER — Encounter: Payer: Self-pay | Admitting: Obstetrics & Gynecology

## 2016-12-28 DIAGNOSIS — Z3402 Encounter for supervision of normal first pregnancy, second trimester: Secondary | ICD-10-CM

## 2016-12-28 DIAGNOSIS — Z23 Encounter for immunization: Secondary | ICD-10-CM

## 2016-12-28 NOTE — Progress Notes (Signed)
   PRENATAL VISIT NOTE  Subjective:  Emily Mclaughlin is a 31 y.o. G2P0010 at 1957w1d being seen today for ongoing prenatal care.  She is currently monitored for the following issues for this low-risk pregnancy and has Acne; Scoliosis; Lordosis; Family history of breast cancer in mother; and Supervision of normal pregnancy on her problem list.  Patient reports no complaints.   . Vag. Bleeding: None.  Movement: Present. Denies leaking of fluid.   The following portions of the patient's history were reviewed and updated as appropriate: allergies, current medications, past family history, past medical history, past social history, past surgical history and problem list. Problem list updated.  Objective:   Vitals:   12/28/16 0815  BP: 118/78  Pulse: 97  Weight: 159 lb (72.1 kg)    Fetal Status: Fetal Heart Rate (bpm): 146   Movement: Present     General:  Alert, oriented and cooperative. Patient is in no acute distress.  Skin: Skin is warm and dry. No rash noted.   Cardiovascular: Normal heart rate noted  Respiratory: Normal respiratory effort, no problems with respiration noted  Abdomen: Soft, gravid, appropriate for gestational age. Pain/Pressure: Absent     Pelvic:  Cervical exam deferred        Extremities: Normal range of motion.  Edema: None  Mental Status: Normal mood and affect. Normal behavior. Normal judgment and thought content.   Assessment and Plan:  Pregnancy: G2P0010 at 157w1d  1. Encounter for supervision of normal first pregnancy in second trimester  - CBC - HIV antibody (with reflex) - RPR - Tdap vaccine greater than or equal to 7yo IM - 2Hr GTT w/ 1 Hr Carpenter 75 g  Preterm labor symptoms and general obstetric precautions including but not limited to vaginal bleeding, contractions, leaking of fluid and fetal movement were reviewed in detail with the patient. Please refer to After Visit Summary for other counseling recommendations.  No Follow-up on  file.   Allie BossierMyra C Damonique Brunelle, MD

## 2016-12-28 NOTE — Progress Notes (Signed)
Routine

## 2016-12-29 LAB — CBC
HCT: 34.9 % — ABNORMAL LOW (ref 35.0–45.0)
HEMOGLOBIN: 11.6 g/dL — AB (ref 11.7–15.5)
MCH: 30.2 pg (ref 27.0–33.0)
MCHC: 33.2 g/dL (ref 32.0–36.0)
MCV: 90.9 fL (ref 80.0–100.0)
MPV: 9 fL (ref 7.5–12.5)
PLATELETS: 196 10*3/uL (ref 140–400)
RBC: 3.84 MIL/uL (ref 3.80–5.10)
RDW: 14.2 % (ref 11.0–15.0)
WBC: 9.7 10*3/uL (ref 3.8–10.8)

## 2016-12-30 LAB — 2HR GTT W 1 HR, CARPENTER, 75 G
GLUCOSE, 2 HR, GEST: 84 mg/dL (ref <153)
Glucose, 1 Hr, Gest: 97 mg/dL (ref <180)
Glucose, Fasting, Gest: 74 mg/dL (ref 65–91)

## 2016-12-30 LAB — RPR

## 2016-12-30 LAB — HIV ANTIBODY (ROUTINE TESTING W REFLEX): HIV: NONREACTIVE

## 2017-01-18 ENCOUNTER — Ambulatory Visit (INDEPENDENT_AMBULATORY_CARE_PROVIDER_SITE_OTHER): Payer: BC Managed Care – PPO | Admitting: Obstetrics & Gynecology

## 2017-01-18 VITALS — BP 109/66 | HR 90 | Wt 164.0 lb

## 2017-01-18 DIAGNOSIS — Z3403 Encounter for supervision of normal first pregnancy, third trimester: Secondary | ICD-10-CM

## 2017-01-18 DIAGNOSIS — M4100 Infantile idiopathic scoliosis, site unspecified: Secondary | ICD-10-CM

## 2017-01-18 DIAGNOSIS — M4050 Lordosis, unspecified, site unspecified: Secondary | ICD-10-CM

## 2017-01-18 DIAGNOSIS — Z803 Family history of malignant neoplasm of breast: Secondary | ICD-10-CM

## 2017-01-18 NOTE — Patient Instructions (Signed)
Third Trimester of Pregnancy The third trimester is from week 28 through week 40 (months 7 through 9). The third trimester is a time when the unborn baby (fetus) is growing rapidly. At the end of the ninth month, the fetus is about 20 inches in length and weighs 6-10 pounds. Body changes during your third trimester Your body will continue to go through many changes during pregnancy. The changes vary from woman to woman. During the third trimester:  Your weight will continue to increase. You can expect to gain 25-35 pounds (11-16 kg) by the end of the pregnancy.  You may begin to get stretch marks on your hips, abdomen, and breasts.  You may urinate more often because the fetus is moving lower into your pelvis and pressing on your bladder.  You may develop or continue to have heartburn. This is caused by increased hormones that slow down muscles in the digestive tract.  You may develop or continue to have constipation because increased hormones slow digestion and cause the muscles that push waste through your intestines to relax.  You may develop hemorrhoids. These are swollen veins (varicose veins) in the rectum that can itch or be painful.  You may develop swollen, bulging veins (varicose veins) in your legs.  You may have increased body aches in the pelvis, back, or thighs. This is due to weight gain and increased hormones that are relaxing your joints.  You may have changes in your hair. These can include thickening of your hair, rapid growth, and changes in texture. Some women also have hair loss during or after pregnancy, or hair that feels dry or thin. Your hair will most likely return to normal after your baby is born.  Your breasts will continue to grow and they will continue to become tender. A yellow fluid (colostrum) may leak from your breasts. This is the first milk you are producing for your baby.  Your belly button may stick out.  You may notice more swelling in your hands,  face, or ankles.  You may have increased tingling or numbness in your hands, arms, and legs. The skin on your belly may also feel numb.  You may feel short of breath because of your expanding uterus.  You may have more problems sleeping. This can be caused by the size of your belly, increased need to urinate, and an increase in your body's metabolism.  You may notice the fetus "dropping," or moving lower in your abdomen (lightening).  You may have increased vaginal discharge.  You may notice your joints feel loose and you may have pain around your pelvic bone.  What to expect at prenatal visits You will have prenatal exams every 2 weeks until week 36. Then you will have weekly prenatal exams. During a routine prenatal visit:  You will be weighed to make sure you and the baby are growing normally.  Your blood pressure will be taken.  Your abdomen will be measured to track your baby's growth.  The fetal heartbeat will be listened to.  Any test results from the previous visit will be discussed.  You may have a cervical check near your due date to see if your cervix has softened or thinned (effaced).  You will be tested for Group B streptococcus. This happens between 35 and 37 weeks.  Your health care provider may ask you:  What your birth plan is.  How you are feeling.  If you are feeling the baby move.  If you have had   any abnormal symptoms, such as leaking fluid, bleeding, severe headaches, or abdominal cramping.  If you are using any tobacco products, including cigarettes, chewing tobacco, and electronic cigarettes.  If you have any questions.  Other tests or screenings that may be performed during your third trimester include:  Blood tests that check for low iron levels (anemia).  Fetal testing to check the health, activity level, and growth of the fetus. Testing is done if you have certain medical conditions or if there are problems during the  pregnancy.  Nonstress test (NST). This test checks the health of your baby to make sure there are no signs of problems, such as the baby not getting enough oxygen. During this test, a belt is placed around your belly. The baby is made to move, and its heart rate is monitored during movement.  What is false labor? False labor is a condition in which you feel small, irregular tightenings of the muscles in the womb (contractions) that usually go away with rest, changing position, or drinking water. These are called Braxton Hicks contractions. Contractions may last for hours, days, or even weeks before true labor sets in. If contractions come at regular intervals, become more frequent, increase in intensity, or become painful, you should see your health care provider. What are the signs of labor?  Abdominal cramps.  Regular contractions that start at 10 minutes apart and become stronger and more frequent with time.  Contractions that start on the top of the uterus and spread down to the lower abdomen and back.  Increased pelvic pressure and dull back pain.  A watery or bloody mucus discharge that comes from the vagina.  Leaking of amniotic fluid. This is also known as your "water breaking." It could be a slow trickle or a gush. Let your health care provider know if it has a color or strange odor. If you have any of these signs, call your health care provider right away, even if it is before your due date. Follow these instructions at home: Medicines  Follow your health care provider's instructions regarding medicine use. Specific medicines may be either safe or unsafe to take during pregnancy.  Take a prenatal vitamin that contains at least 600 micrograms (mcg) of folic acid.  If you develop constipation, try taking a stool softener if your health care provider approves. Eating and drinking  Eat a balanced diet that includes fresh fruits and vegetables, whole grains, good sources of protein  such as meat, eggs, or tofu, and low-fat dairy. Your health care provider will help you determine the amount of weight gain that is right for you.  Avoid raw meat and uncooked cheese. These carry germs that can cause birth defects in the baby.  If you have low calcium intake from food, talk to your health care provider about whether you should take a daily calcium supplement.  Eat four or five small meals rather than three large meals a day.  Limit foods that are high in fat and processed sugars, such as fried and sweet foods.  To prevent constipation: ? Drink enough fluid to keep your urine clear or pale yellow. ? Eat foods that are high in fiber, such as fresh fruits and vegetables, whole grains, and beans. Activity  Exercise only as directed by your health care provider. Most women can continue their usual exercise routine during pregnancy. Try to exercise for 30 minutes at least 5 days a week. Stop exercising if you experience uterine contractions.  Avoid heavy   lifting.  Do not exercise in extreme heat or humidity, or at high altitudes.  Wear low-heel, comfortable shoes.  Practice good posture.  You may continue to have sex unless your health care provider tells you otherwise. Relieving pain and discomfort  Take frequent breaks and rest with your legs elevated if you have leg cramps or low back pain.  Take warm sitz baths to soothe any pain or discomfort caused by hemorrhoids. Use hemorrhoid cream if your health care provider approves.  Wear a good support bra to prevent discomfort from breast tenderness.  If you develop varicose veins: ? Wear support pantyhose or compression stockings as told by your healthcare provider. ? Elevate your feet for 15 minutes, 3-4 times a day. Prenatal care  Write down your questions. Take them to your prenatal visits.  Keep all your prenatal visits as told by your health care provider. This is important. Safety  Wear your seat belt at  all times when driving.  Make a list of emergency phone numbers, including numbers for family, friends, the hospital, and police and fire departments. General instructions  Avoid cat litter boxes and soil used by cats. These carry germs that can cause birth defects in the baby. If you have a cat, ask someone to clean the litter box for you.  Do not travel far distances unless it is absolutely necessary and only with the approval of your health care provider.  Do not use hot tubs, steam rooms, or saunas.  Do not drink alcohol.  Do not use any products that contain nicotine or tobacco, such as cigarettes and e-cigarettes. If you need help quitting, ask your health care provider.  Do not use any medicinal herbs or unprescribed drugs. These chemicals affect the formation and growth of the baby.  Do not douche or use tampons or scented sanitary pads.  Do not cross your legs for long periods of time.  To prepare for the arrival of your baby: ? Take prenatal classes to understand, practice, and ask questions about labor and delivery. ? Make a trial run to the hospital. ? Visit the hospital and tour the maternity area. ? Arrange for maternity or paternity leave through employers. ? Arrange for family and friends to take care of pets while you are in the hospital. ? Purchase a rear-facing car seat and make sure you know how to install it in your car. ? Pack your hospital bag. ? Prepare the baby's nursery. Make sure to remove all pillows and stuffed animals from the baby's crib to prevent suffocation.  Visit your dentist if you have not gone during your pregnancy. Use a soft toothbrush to brush your teeth and be gentle when you floss. Contact a health care provider if:  You are unsure if you are in labor or if your water has broken.  You become dizzy.  You have mild pelvic cramps, pelvic pressure, or nagging pain in your abdominal area.  You have lower back pain.  You have persistent  nausea, vomiting, or diarrhea.  You have an unusual or bad smelling vaginal discharge.  You have pain when you urinate. Get help right away if:  Your water breaks before 37 weeks.  You have regular contractions less than 5 minutes apart before 37 weeks.  You have a fever.  You are leaking fluid from your vagina.  You have spotting or bleeding from your vagina.  You have severe abdominal pain or cramping.  You have rapid weight loss or weight gain.    You have shortness of breath with chest pain.  You notice sudden or extreme swelling of your face, hands, ankles, feet, or legs.  Your baby makes fewer than 10 movements in 2 hours.  You have severe headaches that do not go away when you take medicine.  You have vision changes. Summary  The third trimester is from week 28 through week 40, months 7 through 9. The third trimester is a time when the unborn baby (fetus) is growing rapidly.  During the third trimester, your discomfort may increase as you and your baby continue to gain weight. You may have abdominal, leg, and back pain, sleeping problems, and an increased need to urinate.  During the third trimester your breasts will keep growing and they will continue to become tender. A yellow fluid (colostrum) may leak from your breasts. This is the first milk you are producing for your baby.  False labor is a condition in which you feel small, irregular tightenings of the muscles in the womb (contractions) that eventually go away. These are called Braxton Hicks contractions. Contractions may last for hours, days, or even weeks before true labor sets in.  Signs of labor can include: abdominal cramps; regular contractions that start at 10 minutes apart and become stronger and more frequent with time; watery or bloody mucus discharge that comes from the vagina; increased pelvic pressure and dull back pain; and leaking of amniotic fluid. This information is not intended to replace advice  given to you by your health care provider. Make sure you discuss any questions you have with your health care provider. Document Released: 06/23/2001 Document Revised: 12/05/2015 Document Reviewed: 08/30/2012 Elsevier Interactive Patient Education  2017 [employer]  

## 2017-01-18 NOTE — Progress Notes (Signed)
   PRENATAL VISIT NOTE  Subjective:  Emily Mclaughlin is a 31 y.o. G2P0010 at [redacted]w[redacted]d being seen today for ongoing prenatal care.  She is currently monitored for the following issues for this low-risk pregnancy and has Acne; Scoliosis; Lordosis; Family history of breast cancer in mother; and Supervision of normal pregnancy on her problem list.  Patient reports occ back pain. This is chronic but, pt has not been able to take NSAIDs since she is pregnant.  Contractions: Regular. Vag. Bleeding: None.  Movement: Present. Denies leaking of fluid.   The following portions of the patient's history were reviewed and updated as appropriate: allergies, current medications, past family history, past medical history, past social history, past surgical history and problem list. Problem list updated.  Objective:   Vitals:   01/18/17 1433  BP: 109/66  Pulse: 90  Weight: 164 lb (74.4 kg)    Fetal Status: Fetal Heart Rate (bpm): 154   Movement: Present     General:  Alert, oriented and cooperative. Patient is in no acute distress.  Skin: Skin is warm and dry. No rash noted.   Cardiovascular: Normal heart rate noted  Respiratory: Normal respiratory effort, no problems with respiration noted  Abdomen: Soft, gravid, appropriate for gestational age. Pain/Pressure: Absent     Pelvic:  Cervical exam deferred        Extremities: Normal range of motion.  Edema: None  Mental Status: Normal mood and affect. Normal behavior. Normal judgment and thought content.   Assessment and Plan:  Pregnancy: G2P0010 at [redacted]w[redacted]d  1. Encounter for supervision of normal first pregnancy in third trimester  2. Infantile idiopathic scoliosis, unspecified spinal region Has note from Ortho on chart  3. Lordosis   Preterm labor symptoms and general obstetric precautions including but not limited to vaginal bleeding, contractions, leaking of fluid and fetal movement were reviewed in detail with the patient. Please refer to  After Visit Summary for other counseling recommendations.  Return in about 2 weeks (around 02/01/2017).   Willodean Rosenthalarolyn Harraway-Smith, MD

## 2017-01-19 ENCOUNTER — Encounter: Payer: Self-pay | Admitting: Obstetrics & Gynecology

## 2017-02-02 ENCOUNTER — Ambulatory Visit (INDEPENDENT_AMBULATORY_CARE_PROVIDER_SITE_OTHER): Payer: BC Managed Care – PPO | Admitting: Obstetrics & Gynecology

## 2017-02-02 VITALS — BP 106/64 | HR 88 | Wt 166.0 lb

## 2017-02-02 DIAGNOSIS — Z3483 Encounter for supervision of other normal pregnancy, third trimester: Secondary | ICD-10-CM

## 2017-02-02 NOTE — Patient Instructions (Signed)
Return to clinic for any scheduled appointments or obstetric concerns, or go to [facility] for evaluation  

## 2017-02-02 NOTE — Progress Notes (Signed)
   PRENATAL VISIT NOTE  Subjective:  Emily Mclaughlin is a 31 y.o. G2P0010 at 6247w2d being seen today for ongoing prenatal care.  She is currently monitored for the following issues for this low-risk pregnancy and has Acne; Scoliosis; Lordosis; Family history of breast cancer in mother; and Supervision of normal pregnancy in third trimester on her problem list.  Patient reports no complaints.  Contractions: Not present. Vag. Bleeding: None.  Movement: Present. Denies leaking of fluid.   The following portions of the patient's history were reviewed and updated as appropriate: allergies, current medications, past family history, past medical history, past social history, past surgical history and problem list. Problem list updated.  Objective:   Vitals:   02/02/17 1405  BP: 106/64  Pulse: 88  Weight: 166 lb (75.3 kg)    Fetal Status: Fetal Heart Rate (bpm): 143 Fundal Height: 34 cm Movement: Present     General:  Alert, oriented and cooperative. Patient is in no acute distress.  Skin: Skin is warm and dry. No rash noted.   Cardiovascular: Normal heart rate noted  Respiratory: Normal respiratory effort, no problems with respiration noted  Abdomen: Soft, gravid, appropriate for gestational age.  Pain/Pressure: Absent     Pelvic: Cervical exam deferred        Extremities: Normal range of motion.  Edema: None  Mental Status:  Normal mood and affect. Normal behavior. Normal judgment and thought content.   Assessment and Plan:  Pregnancy: G2P0010 at 6747w2d  1. Encounter for supervision of other normal pregnancy in third trimester She had several questions, all were answered. Preterm labor symptoms and general obstetric precautions including but not limited to vaginal bleeding, contractions, leaking of fluid and fetal movement were reviewed in detail with the patient. Please refer to After Visit Summary for other counseling recommendations.  Return in about 2 weeks (around 02/16/2017) for  OB Visit, Pelvic cultures.   Jaynie CollinsUgonna Irene Mitcham, MD

## 2017-02-15 ENCOUNTER — Other Ambulatory Visit (HOSPITAL_COMMUNITY): Admission: RE | Admit: 2017-02-15 | Payer: BC Managed Care – PPO | Source: Ambulatory Visit

## 2017-02-15 ENCOUNTER — Ambulatory Visit (INDEPENDENT_AMBULATORY_CARE_PROVIDER_SITE_OTHER): Payer: BC Managed Care – PPO | Admitting: Advanced Practice Midwife

## 2017-02-15 VITALS — BP 108/61 | HR 98 | Wt 168.0 lb

## 2017-02-15 DIAGNOSIS — Z3483 Encounter for supervision of other normal pregnancy, third trimester: Secondary | ICD-10-CM

## 2017-02-15 DIAGNOSIS — Z113 Encounter for screening for infections with a predominantly sexual mode of transmission: Secondary | ICD-10-CM | POA: Diagnosis not present

## 2017-02-15 DIAGNOSIS — Z3400 Encounter for supervision of normal first pregnancy, unspecified trimester: Secondary | ICD-10-CM

## 2017-02-15 LAB — OB RESULTS CONSOLE GC/CHLAMYDIA: GC PROBE AMP, GENITAL: NEGATIVE

## 2017-02-15 NOTE — Progress Notes (Signed)
    PRENATAL VISIT NOTE  Subjective:  Emily Mclaughlin is a 31 y.o. G2P0010 at 3638w1d being seen today for ongoing prenatal care.  She is currently monitored for the following issues for this low-risk pregnancy and has Acne; Scoliosis; Lordosis; Family history of breast cancer in mother; and Supervision of normal pregnancy in third trimester on her problem list.  Patient reports no complaints.  Contractions: Not present. Vag. Bleeding: None.  Movement: Present. Denies leaking of fluid.   The following portions of the patient's history were reviewed and updated as appropriate: allergies, current medications, past family history, past medical history, past social history, past surgical history and problem list. Problem list updated.  Objective:   Vitals:   02/15/17 1026  BP: 108/61  Pulse: 98  Weight: 168 lb (76.2 kg)    Fetal Status: Fetal Heart Rate (bpm): NST-R Fundal Height: 36 cm Movement: Present  Presentation: Vertex  General:  Alert, oriented and cooperative. Patient is in no acute distress.  Skin: Skin is warm and dry. No rash noted.   Cardiovascular: Normal heart rate noted  Respiratory: Normal respiratory effort, no problems with respiration noted  Abdomen: Soft, gravid, appropriate for gestational age.  Pain/Pressure: Present     Pelvic: Cervical exam deferred Dilation: Closed Effacement (%): 70 Station: -3  Extremities: Normal range of motion.  Edema: None  Mental Status:  Normal mood and affect. Normal behavior. Normal judgment and thought content.   Assessment and Plan:  Pregnancy: G2P0010 at 5938w1d  1. Supervision of normal first pregnancy, antepartum --Doppler FHR 160s-170s so NST done and reactive --Vertex by unsure Leopolds but US not done to confirm, will need confirmation of vertex at next visit - Culture, beta strep (group b only) - Urine cytology ancillary only --Pt with questions about due date, prefers not to have IOL.  Reviewed chart and EDD changed to  US dates at 13 weeks but dates were exactly 7 days different, borderline based on ACOG US dating criteria.  Will leave date based on US for now but consider reevaluating if pt needs postdates IOL.    Term labor symptoms and general obstetric precautions including but not limited to vaginal bleeding, contractions, leaking of fluid and fetal movement were reviewed in detail with the patient. Please refer to After Visit Summary for other counseling recommendations.  No Follow-up on file.   Sharen CounterLisa Leftwich-Kirby, CNM

## 2017-02-16 LAB — URINE CYTOLOGY ANCILLARY ONLY
CHLAMYDIA, DNA PROBE: NEGATIVE
NEISSERIA GONORRHEA: NEGATIVE

## 2017-02-18 LAB — CULTURE, BETA STREP (GROUP B ONLY)

## 2017-02-18 LAB — OB RESULTS CONSOLE GBS: GBS: NEGATIVE

## 2017-02-22 ENCOUNTER — Ambulatory Visit (INDEPENDENT_AMBULATORY_CARE_PROVIDER_SITE_OTHER): Payer: BC Managed Care – PPO | Admitting: Obstetrics & Gynecology

## 2017-02-22 VITALS — BP 116/72 | HR 95 | Wt 172.0 lb

## 2017-02-22 DIAGNOSIS — Z3493 Encounter for supervision of normal pregnancy, unspecified, third trimester: Secondary | ICD-10-CM

## 2017-02-22 DIAGNOSIS — Z3483 Encounter for supervision of other normal pregnancy, third trimester: Secondary | ICD-10-CM

## 2017-02-22 NOTE — Progress Notes (Signed)
   PRENATAL VISIT NOTE  Subjective:  Emily Mclaughlin is a 31 y.o. G2P0010 at 1347w1d being seen today for ongoing prenatal care.  She is currently monitored for the following issues for this low-risk pregnancy and has Acne; Scoliosis; Lordosis; Family history of breast cancer in mother; and Supervision of normal pregnancy in third trimester on her problem list.  Patient reports no complaints.  Contractions: Not present. Vag. Bleeding: None.  Movement: Present. Denies leaking of fluid.   The following portions of the patient's history were reviewed and updated as appropriate: allergies, current medications, past family history, past medical history, past social history, past surgical history and problem list. Problem list updated.  Objective:   Vitals:   02/22/17 1352  BP: 116/72  Pulse: 95  Weight: 172 lb (78 kg)    Fetal Status:     Movement: Present     General:  Alert, oriented and cooperative. Patient is in no acute distress.  Skin: Skin is warm and dry. No rash noted.   Cardiovascular: Normal heart rate noted  Respiratory: Normal respiratory effort, no problems with respiration noted  Abdomen: Soft, gravid, appropriate for gestational age.  Pain/Pressure: Present     Pelvic: Cervical exam deferred        Extremities: Normal range of motion.  Edema: None  Mental Status:  Normal mood and affect. Normal behavior. Normal judgment and thought content.   Assessment and Plan:  Pregnancy: G2P0010 at 5047w1d  1. Encounter for supervision of normal pregnancy in third trimester, unspecified gravidity Rec kick counts  Preterm labor symptoms and general obstetric precautions including but not limited to vaginal bleeding, contractions, leaking of fluid and fetal movement were reviewed in detail with the patient. Please refer to After Visit Summary for other counseling recommendations.  No Follow-up on file.   Allie BossierMyra C Niccole Witthuhn, MD

## 2017-02-26 ENCOUNTER — Encounter: Payer: BC Managed Care – PPO | Admitting: Advanced Practice Midwife

## 2017-03-01 ENCOUNTER — Encounter: Payer: Self-pay | Admitting: *Deleted

## 2017-03-01 ENCOUNTER — Ambulatory Visit (INDEPENDENT_AMBULATORY_CARE_PROVIDER_SITE_OTHER): Payer: BC Managed Care – PPO | Admitting: Advanced Practice Midwife

## 2017-03-01 VITALS — BP 112/68 | HR 89 | Wt 170.0 lb

## 2017-03-01 DIAGNOSIS — Z3493 Encounter for supervision of normal pregnancy, unspecified, third trimester: Secondary | ICD-10-CM

## 2017-03-01 NOTE — Patient Instructions (Signed)
Third Trimester of Pregnancy The third trimester is from week 28 through week 40 (months 7 through 9). The third trimester is a time when the unborn baby (fetus) is growing rapidly. At the end of the ninth month, the fetus is about 20 inches in length and weighs 6-10 pounds. Body changes during your third trimester Your body will continue to go through many changes during pregnancy. The changes vary from woman to woman. During the third trimester:  Your weight will continue to increase. You can expect to gain 25-35 pounds (11-16 kg) by the end of the pregnancy.  You may begin to get stretch marks on your hips, abdomen, and breasts.  You may urinate more often because the fetus is moving lower into your pelvis and pressing on your bladder.  You may develop or continue to have heartburn. This is caused by increased hormones that slow down muscles in the digestive tract.  You may develop or continue to have constipation because increased hormones slow digestion and cause the muscles that push waste through your intestines to relax.  You may develop hemorrhoids. These are swollen veins (varicose veins) in the rectum that can itch or be painful.  You may develop swollen, bulging veins (varicose veins) in your legs.  You may have increased body aches in the pelvis, back, or thighs. This is due to weight gain and increased hormones that are relaxing your joints.  You may have changes in your hair. These can include thickening of your hair, rapid growth, and changes in texture. Some women also have hair loss during or after pregnancy, or hair that feels dry or thin. Your hair will most likely return to normal after your baby is born.  Your breasts will continue to grow and they will continue to become tender. A yellow fluid (colostrum) may leak from your breasts. This is the first milk you are producing for your baby.  Your belly button may stick out.  You may notice more swelling in your hands,  face, or ankles.  You may have increased tingling or numbness in your hands, arms, and legs. The skin on your belly may also feel numb.  You may feel short of breath because of your expanding uterus.  You may have more problems sleeping. This can be caused by the size of your belly, increased need to urinate, and an increase in your body's metabolism.  You may notice the fetus "dropping," or moving lower in your abdomen (lightening).  You may have increased vaginal discharge.  You may notice your joints feel loose and you may have pain around your pelvic bone.  What to expect at prenatal visits You will have prenatal exams every 2 weeks until week 36. Then you will have weekly prenatal exams. During a routine prenatal visit:  You will be weighed to make sure you and the baby are growing normally.  Your blood pressure will be taken.  Your abdomen will be measured to track your baby's growth.  The fetal heartbeat will be listened to.  Any test results from the previous visit will be discussed.  You may have a cervical check near your due date to see if your cervix has softened or thinned (effaced).  You will be tested for Group B streptococcus. This happens between 35 and 37 weeks.  Your health care provider may ask you:  What your birth plan is.  How you are feeling.  If you are feeling the baby move.  If you have had   any abnormal symptoms, such as leaking fluid, bleeding, severe headaches, or abdominal cramping.  If you are using any tobacco products, including cigarettes, chewing tobacco, and electronic cigarettes.  If you have any questions.  Other tests or screenings that may be performed during your third trimester include:  Blood tests that check for low iron levels (anemia).  Fetal testing to check the health, activity level, and growth of the fetus. Testing is done if you have certain medical conditions or if there are problems during the  pregnancy.  Nonstress test (NST). This test checks the health of your baby to make sure there are no signs of problems, such as the baby not getting enough oxygen. During this test, a belt is placed around your belly. The baby is made to move, and its heart rate is monitored during movement.  What is false labor? False labor is a condition in which you feel small, irregular tightenings of the muscles in the womb (contractions) that usually go away with rest, changing position, or drinking water. These are called Braxton Hicks contractions. Contractions may last for hours, days, or even weeks before true labor sets in. If contractions come at regular intervals, become more frequent, increase in intensity, or become painful, you should see your health care provider. What are the signs of labor?  Abdominal cramps.  Regular contractions that start at 10 minutes apart and become stronger and more frequent with time.  Contractions that start on the top of the uterus and spread down to the lower abdomen and back.  Increased pelvic pressure and dull back pain.  A watery or bloody mucus discharge that comes from the vagina.  Leaking of amniotic fluid. This is also known as your "water breaking." It could be a slow trickle or a gush. Let your health care provider know if it has a color or strange odor. If you have any of these signs, call your health care provider right away, even if it is before your due date. Follow these instructions at home: Medicines  Follow your health care provider's instructions regarding medicine use. Specific medicines may be either safe or unsafe to take during pregnancy.  Take a prenatal vitamin that contains at least 600 micrograms (mcg) of folic acid.  If you develop constipation, try taking a stool softener if your health care provider approves. Eating and drinking  Eat a balanced diet that includes fresh fruits and vegetables, whole grains, good sources of protein  such as meat, eggs, or tofu, and low-fat dairy. Your health care provider will help you determine the amount of weight gain that is right for you.  Avoid raw meat and uncooked cheese. These carry germs that can cause birth defects in the baby.  If you have low calcium intake from food, talk to your health care provider about whether you should take a daily calcium supplement.  Eat four or five small meals rather than three large meals a day.  Limit foods that are high in fat and processed sugars, such as fried and sweet foods.  To prevent constipation: ? Drink enough fluid to keep your urine clear or pale yellow. ? Eat foods that are high in fiber, such as fresh fruits and vegetables, whole grains, and beans. Activity  Exercise only as directed by your health care provider. Most women can continue their usual exercise routine during pregnancy. Try to exercise for 30 minutes at least 5 days a week. Stop exercising if you experience uterine contractions.  Avoid heavy   lifting.  Do not exercise in extreme heat or humidity, or at high altitudes.  Wear low-heel, comfortable shoes.  Practice good posture.  You may continue to have sex unless your health care provider tells you otherwise. Relieving pain and discomfort  Take frequent breaks and rest with your legs elevated if you have leg cramps or low back pain.  Take warm sitz baths to soothe any pain or discomfort caused by hemorrhoids. Use hemorrhoid cream if your health care provider approves.  Wear a good support bra to prevent discomfort from breast tenderness.  If you develop varicose veins: ? Wear support pantyhose or compression stockings as told by your healthcare provider. ? Elevate your feet for 15 minutes, 3-4 times a day. Prenatal care  Write down your questions. Take them to your prenatal visits.  Keep all your prenatal visits as told by your health care provider. This is important. Safety  Wear your seat belt at  all times when driving.  Make a list of emergency phone numbers, including numbers for family, friends, the hospital, and police and fire departments. General instructions  Avoid cat litter boxes and soil used by cats. These carry germs that can cause birth defects in the baby. If you have a cat, ask someone to clean the litter box for you.  Do not travel far distances unless it is absolutely necessary and only with the approval of your health care provider.  Do not use hot tubs, steam rooms, or saunas.  Do not drink alcohol.  Do not use any products that contain nicotine or tobacco, such as cigarettes and e-cigarettes. If you need help quitting, ask your health care provider.  Do not use any medicinal herbs or unprescribed drugs. These chemicals affect the formation and growth of the baby.  Do not douche or use tampons or scented sanitary pads.  Do not cross your legs for long periods of time.  To prepare for the arrival of your baby: ? Take prenatal classes to understand, practice, and ask questions about labor and delivery. ? Make a trial run to the hospital. ? Visit the hospital and tour the maternity area. ? Arrange for maternity or paternity leave through employers. ? Arrange for family and friends to take care of pets while you are in the hospital. ? Purchase a rear-facing car seat and make sure you know how to install it in your car. ? Pack your hospital bag. ? Prepare the baby's nursery. Make sure to remove all pillows and stuffed animals from the baby's crib to prevent suffocation.  Visit your dentist if you have not gone during your pregnancy. Use a soft toothbrush to brush your teeth and be gentle when you floss. Contact a health care provider if:  You are unsure if you are in labor or if your water has broken.  You become dizzy.  You have mild pelvic cramps, pelvic pressure, or nagging pain in your abdominal area.  You have lower back pain.  You have persistent  nausea, vomiting, or diarrhea.  You have an unusual or bad smelling vaginal discharge.  You have pain when you urinate. Get help right away if:  Your water breaks before 37 weeks.  You have regular contractions less than 5 minutes apart before 37 weeks.  You have a fever.  You are leaking fluid from your vagina.  You have spotting or bleeding from your vagina.  You have severe abdominal pain or cramping.  You have rapid weight loss or weight gain.    You have shortness of breath with chest pain.  You notice sudden or extreme swelling of your face, hands, ankles, feet, or legs.  Your baby makes fewer than 10 movements in 2 hours.  You have severe headaches that do not go away when you take medicine.  You have vision changes. Summary  The third trimester is from week 28 through week 40, months 7 through 9. The third trimester is a time when the unborn baby (fetus) is growing rapidly.  During the third trimester, your discomfort may increase as you and your baby continue to gain weight. You may have abdominal, leg, and back pain, sleeping problems, and an increased need to urinate.  During the third trimester your breasts will keep growing and they will continue to become tender. A yellow fluid (colostrum) may leak from your breasts. This is the first milk you are producing for your baby.  False labor is a condition in which you feel small, irregular tightenings of the muscles in the womb (contractions) that eventually go away. These are called Braxton Hicks contractions. Contractions may last for hours, days, or even weeks before true labor sets in.  Signs of labor can include: abdominal cramps; regular contractions that start at 10 minutes apart and become stronger and more frequent with time; watery or bloody mucus discharge that comes from the vagina; increased pelvic pressure and dull back pain; and leaking of amniotic fluid. This information is not intended to replace advice  given to you by your health care provider. Make sure you discuss any questions you have with your health care provider. Document Released: 06/23/2001 Document Revised: 12/05/2015 Document Reviewed: 08/30/2012 Elsevier Interactive Patient Education  2017 [employer]  

## 2017-03-01 NOTE — Progress Notes (Signed)
   PRENATAL VISIT NOTE  Subjective:  Emily Mclaughlin is a 31 y.o. G2P0010 at [redacted]w[redacted]d being seen today for ongoing prenatal care.  She is currently monitored for the following issues for this low-risk pregnancy and has Acne; Scoliosis; Lordosis; Family history of breast cancer in mother; and Supervision of normal pregnancy in third trimester on her problem list.  Patient reports no complaints.  Contractions: Irritability. Vag. Bleeding: None.  Movement: Present. Denies leaking of fluid.   The following portions of the patient's history were reviewed and updated as appropriate: allergies, current medications, past family history, past medical history, past social history, past surgical history and problem list. Problem list updated.  Objective:   Vitals:   03/01/17 1040  BP: 112/68  Pulse: 89  Weight: 170 lb (77.1 kg)    Fetal Status: Fetal Heart Rate (bpm): 150   Movement: Present     General:  Alert, oriented and cooperative. Patient is in no acute distress.  Skin: Skin is warm and dry. No rash noted.   Cardiovascular: Normal heart rate noted  Respiratory: Normal respiratory effort, no problems with respiration noted  Abdomen: Soft, gravid, appropriate for gestational age.  Pain/Pressure: Present     Pelvic: Cervical exam performed        Extremities: Normal range of motion.  Edema: Trace  Mental Status:  Normal mood and affect. Normal behavior. Normal judgment and thought content.   Assessment and Plan:  Pregnancy: G2P0010 at [redacted]w[redacted]d   Term labor symptoms and general obstetric precautions including but not limited to vaginal bleeding, contractions, leaking of fluid and fetal movement were reviewed in detail with the patient. Please refer to After Visit Summary for other counseling recommendations.   Wants to go to [employer] for [employer] play, travel precautions/risk of labor discussed   Wynelle Bourgeois, CNM

## 2017-03-08 ENCOUNTER — Ambulatory Visit (INDEPENDENT_AMBULATORY_CARE_PROVIDER_SITE_OTHER): Payer: BC Managed Care – PPO | Admitting: Advanced Practice Midwife

## 2017-03-08 ENCOUNTER — Encounter: Payer: Self-pay | Admitting: Advanced Practice Midwife

## 2017-03-08 VITALS — BP 103/69 | HR 88 | Wt 173.0 lb

## 2017-03-08 DIAGNOSIS — Z3493 Encounter for supervision of normal pregnancy, unspecified, third trimester: Secondary | ICD-10-CM

## 2017-03-08 NOTE — Patient Instructions (Signed)
Vaginal Delivery Vaginal delivery means that you will give birth by pushing your baby out of your birth canal (vagina). A team of health care providers will help you before, during, and after vaginal delivery. Birth experiences are unique for every woman and every pregnancy, and birth experiences vary depending on where you choose to give birth. What should I do to prepare for my baby's birth? Before your baby is born, it is important to talk with your health care provider about:  Your labor and delivery preferences. These may include: ? Medicines that you may be given. ? How you will manage your pain. This might include non-medical pain relief techniques or injectable pain relief such as epidural analgesia. ? How you and your baby will be monitored during labor and delivery. ? Who may be in the labor and delivery room with you. ? Your feelings about surgical delivery of your baby (cesarean delivery, or C-section) if this becomes necessary. ? Your feelings about receiving donated blood through an IV tube (blood transfusion) if this becomes necessary.  Whether you are able: ? To take pictures or videos of the birth. ? To eat during labor and delivery. ? To move around, walk, or change positions during labor and delivery.  What to expect after your baby is born, such as: ? Whether delayed umbilical cord clamping and cutting is offered. ? Who will care for your baby right after birth. ? Medicines or tests that may be recommended for your baby. ? Whether breastfeeding is supported in your hospital or birth center. ? How long you will be in the hospital or birth center.  How any medical conditions you have may affect your baby or your labor and delivery experience.  To prepare for your baby's birth, you should also:  Attend all of your health care visits before delivery (prenatal visits) as recommended by your health care provider. This is important.  Prepare your home for your baby's  arrival. Make sure that you have: ? Diapers. ? Baby clothing. ? Feeding equipment. ? Safe sleeping arrangements for you and your baby.  Install a car seat in your vehicle. Have your car seat checked by a certified car seat installer to make sure that it is installed safely.  Think about who will help you with your new baby at home for at least the first several weeks after delivery.  What can I expect when I arrive at the birth center or hospital? Once you are in labor and have been admitted into the hospital or birth center, your health care provider may:  Review your pregnancy history and any concerns you have.  Insert an IV tube into one of your veins. This is used to give you fluids and medicines.  Check your blood pressure, pulse, temperature, and heart rate (vital signs).  Check whether your bag of water (amniotic sac) has broken (ruptured).  Talk with you about your birth plan and discuss pain control options.  Monitoring Your health care provider may monitor your contractions (uterine monitoring) and your baby's heart rate (fetal monitoring). You may need to be monitored:  Often, but not continuously (intermittently).  All the time or for long periods at a time (continuously). Continuous monitoring may be needed if: ? You are taking certain medicines, such as medicine to relieve pain or make your contractions stronger. ? You have pregnancy or labor complications.  Monitoring may be done by:  Placing a special stethoscope or a handheld monitoring device on your abdomen to   check your baby's heartbeat, and feeling your abdomen for contractions. This method of monitoring does not continuously record your baby's heartbeat or your contractions.  Placing monitors on your abdomen (external monitors) to record your baby's heartbeat and the frequency and length of contractions. You may not have to wear external monitors all the time.  Placing monitors inside of your uterus  (internal monitors) to record your baby's heartbeat and the frequency, length, and strength of your contractions. ? Your health care provider may use internal monitors if he or she needs more information about the strength of your contractions or your baby's heart rate. ? Internal monitors are put in place by passing a thin, flexible wire through your vagina and into your uterus. Depending on the type of monitor, it may remain in your uterus or on your baby's head until birth. ? Your health care provider will discuss the benefits and risks of internal monitoring with you and will ask for your permission before inserting the monitors.  Telemetry. This is a type of continuous monitoring that can be done with external or internal monitors. Instead of having to stay in bed, you are able to move around during telemetry. Ask your health care provider if telemetry is an option for you.  Physical exam Your health care provider may perform a physical exam. This may include:  Checking whether your baby is positioned: ? With the head toward your vagina (head-down). This is most common. ? With the head toward the top of your uterus (head-up or breech). If your baby is in a breech position, your health care provider may try to turn your baby to a head-down position so you can deliver vaginally. If it does not seem that your baby can be born vaginally, your provider may recommend surgery to deliver your baby. In rare cases, you may be able to deliver vaginally if your baby is head-up (breech delivery). ? Lying sideways (transverse). Babies that are lying sideways cannot be delivered vaginally.  Checking your cervix to determine: ? Whether it is thinning out (effacing). ? Whether it is opening up (dilating). ? How low your baby has moved into your birth canal.  What are the three stages of labor and delivery?  Normal labor and delivery is divided into the following three stages: Stage 1  Stage 1 is the  longest stage of labor, and it can last for hours or days. Stage 1 includes: ? Early labor. This is when contractions may be irregular, or regular and mild. Generally, early labor contractions are more than 10 minutes apart. ? Active labor. This is when contractions get longer, more regular, more frequent, and more intense. ? The transition phase. This is when contractions happen very close together, are very intense, and may last longer than during any other part of labor.  Contractions generally feel mild, infrequent, and irregular at first. They get stronger, more frequent (about every 2-3 minutes), and more regular as you progress from early labor through active labor and transition.  Many women progress through stage 1 naturally, but you may need help to continue making progress. If this happens, your health care provider may talk with you about: ? Rupturing your amniotic sac if it has not ruptured yet. ? Giving you medicine to help make your contractions stronger and more frequent.  Stage 1 ends when your cervix is completely dilated to 4 inches (10 cm) and completely effaced. This happens at the end of the transition phase. Stage 2  Once   your cervix is completely effaced and dilated to 4 inches (10 cm), you may start to feel an urge to push. It is common for the body to naturally take a rest before feeling the urge to push, especially if you received an epidural or certain other pain medicines. This rest period may last for up to 1-2 hours, depending on your unique labor experience.  During stage 2, contractions are generally less painful, because pushing helps relieve contraction pain. Instead of contraction pain, you may feel stretching and burning pain, especially when the widest part of your baby's head passes through the vaginal opening (crowning).  Your health care provider will closely monitor your pushing progress and your baby's progress through the vagina during stage 2.  Your  health care provider may massage the area of skin between your vaginal opening and anus (perineum) or apply warm compresses to your perineum. This helps it stretch as the baby's head starts to crown, which can help prevent perineal tearing. ? In some cases, an incision may be made in your perineum (episiotomy) to allow the baby to pass through the vaginal opening. An episiotomy helps to make the opening of the vagina larger to allow more room for the baby to fit through.  It is very important to breathe and focus so your health care provider can control the delivery of your baby's head. Your health care provider may have you decrease the intensity of your pushing, to help prevent perineal tearing.  After delivery of your baby's head, the shoulders and the rest of the body generally deliver very quickly and without difficulty.  Once your baby is delivered, the umbilical cord may be cut right away, or this may be delayed for 1-2 minutes, depending on your baby's health. This may vary among health care providers, hospitals, and birth centers.  If you and your baby are healthy enough, your baby may be placed on your chest or abdomen to help maintain the baby's temperature and to help you bond with each other. Some mothers and babies start breastfeeding at this time. Your health care team will dry your baby and help keep your baby warm during this time.  Your baby may need immediate care if he or she: ? Showed signs of distress during labor. ? Has a medical condition. ? Was born too early (prematurely). ? Had a bowel movement before birth (meconium). ? Shows signs of difficulty transitioning from being inside the uterus to being outside of the uterus. If you are planning to breastfeed, your health care team will help you begin a feeding. Stage 3  The third stage of labor starts immediately after the birth of your baby and ends after you deliver the placenta. The placenta is an organ that develops  during pregnancy to provide oxygen and nutrients to your baby in the womb.  Delivering the placenta may require some pushing, and you may have mild contractions. Breastfeeding can stimulate contractions to help you deliver the placenta.  After the placenta is delivered, your uterus should tighten (contract) and become firm. This helps to stop bleeding in your uterus. To help your uterus contract and to control bleeding, your health care provider may: ? Give you medicine by injection, through an IV tube, by mouth, or through your rectum (rectally). ? Massage your abdomen or perform a vaginal exam to remove any blood clots that are left in your uterus. ? Empty your bladder by placing a thin, flexible tube (catheter) into your bladder. ? Encourage   you to breastfeed your baby. After labor is over, you and your baby will be monitored closely to ensure that you are both healthy until you are ready to go home. Your health care team will teach you how to care for yourself and your baby. This information is not intended to replace advice given to you by your health care provider. Make sure you discuss any questions you have with your health care provider. Document Released: 04/07/2008 Document Revised: 01/17/2016 Document Reviewed: 07/14/2015 Elsevier Interactive Patient Education  2018 [employer]  

## 2017-03-08 NOTE — Progress Notes (Signed)
   PRENATAL VISIT NOTE  Subjective:  Emily Mclaughlin is a 31 y.o. G2P0010 at [redacted]w[redacted]d being seen today for ongoing prenatal care.  She is currently monitored for the following issues for this low-risk pregnancy and has Acne; Scoliosis; Lordosis; Family history of breast cancer in mother; and Supervision of normal pregnancy in third trimester on her problem list.  Patient reports no complaints.  Contractions: Irritability. Vag. Bleeding: None.  Movement: Present. Denies leaking of fluid.   The following portions of the patient's history were reviewed and updated as appropriate: allergies, current medications, past family history, past medical history, past social history, past surgical history and problem list. Problem list updated.  Objective:   Vitals:   03/08/17 1003  BP: 103/69  Pulse: 88  Weight: 173 lb (78.5 kg)    Fetal Status: Fetal Heart Rate (bpm): 153   Movement: Present     General:  Alert, oriented and cooperative. Patient is in no acute distress.  Skin: Skin is warm and dry. No rash noted.   Cardiovascular: Normal heart rate noted  Respiratory: Normal respiratory effort, no problems with respiration noted  Abdomen: Soft, gravid, appropriate for gestational age.  Pain/Pressure: Present     Pelvic: Cervical exam performed       Unchanged/FT/75/-2/vtx  Extremities: Normal range of motion.  Edema: Trace  Mental Status:  Normal mood and affect. Normal behavior. Normal judgment and thought content.   Assessment and Plan:  Pregnancy: G2P0010 at [redacted]w[redacted]d  There are no diagnoses linked to this encounter. Term labor symptoms and general obstetric precautions including but not limited to vaginal bleeding, contractions, leaking of fluid and fetal movement were reviewed in detail with the patient. Please refer to After Visit Summary for other counseling recommendations.  RTO 1 wk  Wynelle Bourgeois, CNM

## 2017-03-15 ENCOUNTER — Inpatient Hospital Stay (HOSPITAL_COMMUNITY)
Admission: AD | Admit: 2017-03-15 | Discharge: 2017-03-16 | DRG: 775 | Disposition: A | Discharge status: Home / Self Care | Payer: BC Managed Care – PPO | Source: Ambulatory Visit | Attending: Obstetrics & Gynecology | Admitting: Obstetrics & Gynecology

## 2017-03-15 ENCOUNTER — Encounter (HOSPITAL_COMMUNITY): Payer: Self-pay

## 2017-03-15 DIAGNOSIS — Z3A40 40 weeks gestation of pregnancy: Secondary | ICD-10-CM

## 2017-03-15 DIAGNOSIS — M419 Scoliosis, unspecified: Secondary | ICD-10-CM | POA: Diagnosis present

## 2017-03-15 DIAGNOSIS — Z3493 Encounter for supervision of normal pregnancy, unspecified, third trimester: Secondary | ICD-10-CM | POA: Diagnosis present

## 2017-03-15 DIAGNOSIS — Z8759 Personal history of other complications of pregnancy, childbirth and the puerperium: Secondary | ICD-10-CM

## 2017-03-15 LAB — CBC
HEMATOCRIT: 37.4 % (ref 36.0–46.0)
HEMOGLOBIN: 12.6 g/dL (ref 12.0–15.0)
MCH: 28.9 pg (ref 26.0–34.0)
MCHC: 33.7 g/dL (ref 30.0–36.0)
MCV: 85.8 fL (ref 78.0–100.0)
Platelets: 219 10*3/uL (ref 150–400)
RBC: 4.36 MIL/uL (ref 3.87–5.11)
RDW: 13.9 % (ref 11.5–15.5)
WBC: 16.5 10*3/uL — AB (ref 4.0–10.5)

## 2017-03-15 LAB — TYPE AND SCREEN
ABO/RH(D): A POS
ANTIBODY SCREEN: NEGATIVE

## 2017-03-15 LAB — POCT FERN TEST: POCT Fern Test: POSITIVE

## 2017-03-15 MED ORDER — DIPHENHYDRAMINE HCL 25 MG PO CAPS: 25.0000 mg | ORAL_CAPSULE | Freq: Four times a day (QID) | ORAL | Status: DC | PRN

## 2017-03-15 MED ORDER — SOD CITRATE-CITRIC ACID 500-334 MG/5ML PO SOLN: 30.0000 mL | ORAL | Status: DC | PRN

## 2017-03-15 MED ORDER — ONDANSETRON HCL 4 MG PO TABS: 4.0000 mg | ORAL_TABLET | ORAL | Status: DC | PRN

## 2017-03-15 MED ORDER — ZOLPIDEM TARTRATE 5 MG PO TABS: 5.0000 mg | ORAL_TABLET | Freq: Every evening | ORAL | Status: DC | PRN

## 2017-03-15 MED ORDER — SODIUM CHLORIDE 0.9% FLUSH: 3.0000 mL | Freq: Two times a day (BID) | INTRAVENOUS | Status: DC

## 2017-03-15 MED ORDER — IBUPROFEN 600 MG PO TABS
600.0000 mg | ORAL_TABLET | Freq: Four times a day (QID) | ORAL | Status: DC
  Administered 2017-03-15 – 2017-03-16 (×5): 600 mg via ORAL
  Filled 2017-03-15 (×5): qty 1

## 2017-03-15 MED ORDER — LACTATED RINGERS IV SOLN: INTRAVENOUS | Status: DC

## 2017-03-15 MED ORDER — WITCH HAZEL-GLYCERIN EX PADS: 1.0000 "application " | MEDICATED_PAD | CUTANEOUS | Status: DC | PRN

## 2017-03-15 MED ORDER — SENNOSIDES-DOCUSATE SODIUM 8.6-50 MG PO TABS
2.0000 | ORAL_TABLET | ORAL | Status: DC
  Administered 2017-03-16: 2 via ORAL
  Filled 2017-03-15: qty 2

## 2017-03-15 MED ORDER — COCONUT OIL OIL: 1.0000 "application " | TOPICAL_OIL | Status: DC | PRN

## 2017-03-15 MED ORDER — LACTATED RINGERS IV SOLN: 500.0000 mL | INTRAVENOUS | Status: DC | PRN

## 2017-03-15 MED ORDER — OXYCODONE HCL 5 MG PO TABS: 5.0000 mg | ORAL_TABLET | ORAL | Status: DC | PRN

## 2017-03-15 MED ORDER — PRENATAL MULTIVITAMIN CH
1.0000 | ORAL_TABLET | Freq: Every day | ORAL | Status: DC
  Administered 2017-03-15 – 2017-03-16 (×2): 1 via ORAL
  Filled 2017-03-15 (×2): qty 1

## 2017-03-15 MED ORDER — OXYTOCIN BOLUS FROM INFUSION
500.0000 mL | Freq: Once | INTRAVENOUS | Status: AC
  Administered 2017-03-15: 500 mL via INTRAVENOUS

## 2017-03-15 MED ORDER — OXYCODONE HCL 5 MG PO TABS
10.0000 mg | ORAL_TABLET | ORAL | Status: DC | PRN
Start: 2017-03-15 — End: 2017-03-16

## 2017-03-15 MED ORDER — ACETAMINOPHEN 325 MG PO TABS: 650.0000 mg | ORAL_TABLET | ORAL | Status: DC | PRN

## 2017-03-15 MED ORDER — FLEET ENEMA 7-19 GM/118ML RE ENEM: 1.0000 | ENEMA | RECTAL | Status: DC | PRN

## 2017-03-15 MED ORDER — BENZOCAINE-MENTHOL 20-0.5 % EX AERO
1.0000 "application " | INHALATION_SPRAY | CUTANEOUS | Status: DC | PRN
  Administered 2017-03-15: 1 via TOPICAL
  Filled 2017-03-15: qty 56

## 2017-03-15 MED ORDER — SODIUM CHLORIDE 0.9 % IV SOLN: 250.0000 mL | INTRAVENOUS | Status: DC | PRN

## 2017-03-15 MED ORDER — OXYCODONE-ACETAMINOPHEN 5-325 MG PO TABS: 2.0000 | ORAL_TABLET | ORAL | Status: DC | PRN

## 2017-03-15 MED ORDER — ONDANSETRON HCL 4 MG/2ML IJ SOLN: 4.0000 mg | Freq: Four times a day (QID) | INTRAMUSCULAR | Status: DC | PRN

## 2017-03-15 MED ORDER — OXYTOCIN 40 UNITS IN LACTATED RINGERS INFUSION - SIMPLE MED
2.5000 [IU]/h | INTRAVENOUS | Status: DC
  Filled 2017-03-15: qty 1000

## 2017-03-15 MED ORDER — SIMETHICONE 80 MG PO CHEW: 80.0000 mg | CHEWABLE_TABLET | ORAL | Status: DC | PRN

## 2017-03-15 MED ORDER — OXYCODONE-ACETAMINOPHEN 5-325 MG PO TABS: 1.0000 | ORAL_TABLET | ORAL | Status: DC | PRN

## 2017-03-15 MED ORDER — ONDANSETRON HCL 4 MG/2ML IJ SOLN: 4.0000 mg | INTRAMUSCULAR | Status: DC | PRN

## 2017-03-15 MED ORDER — LIDOCAINE HCL (PF) 1 % IJ SOLN
30.0000 mL | INTRAMUSCULAR | Status: DC | PRN
  Administered 2017-03-15: 30 mL via SUBCUTANEOUS
  Filled 2017-03-15: qty 30

## 2017-03-15 MED ORDER — SODIUM CHLORIDE 0.9% FLUSH: 3.0000 mL | INTRAVENOUS | Status: DC | PRN

## 2017-03-15 MED ORDER — DIBUCAINE 1 % RE OINT
1.0000 "application " | TOPICAL_OINTMENT | RECTAL | Status: DC | PRN
  Administered 2017-03-15: 1 via RECTAL
  Filled 2017-03-15: qty 28

## 2017-03-15 MED ORDER — TETANUS-DIPHTH-ACELL PERTUSSIS 5-2.5-18.5 LF-MCG/0.5 IM SUSP: 0.5000 mL | Freq: Once | INTRAMUSCULAR | Status: DC

## 2017-03-15 NOTE — MAU Note (Signed)
Pt states water broke sometime during the night but is unsure of time. States she is having contractions every couple of mins. Pt denies vaginal bleeding. Reports good fetal movement. States cervix was 0.5cm last week.

## 2017-03-15 NOTE — Lactation Note (Signed)
This note was copied from a baby's chart. Lactation Consultation Note  Patient Name: Emily Mclaughlin Today's Date: 03/15/2017 Reason for consult: Initial assessment Infant is 47 hours old & seen by [facility] for Initial Assessment. Baby was born at 72w1dand weighed 6 lbs 15.6 oz at birth. Baby was asleep in basinet when [place]illalba[place]. Mom reports that she thinks BF is going well but since this is her first baby, she is not 100% sure. Mom had many questions. Provided mom with BF booklet, BF resources, and feeding log; mom made aware of O/P services, breastfeeding support groups, community resources, and our phone # for post-discharge questions. Mom encouraged to feed baby 8-12 times/24 hours and with feeding cues. Discussed newborn stomach size, milk volume, importance of skin-to-skin, milk storage guidelines, I/O, proper latch. Mom reports she would like a review of hand expression so mom did so on both breasts with LC watching and drops were noted on both nipples.  Mom reports her insurance does not provide pumps but may use her sister-in-law's Medela pump 'n style & has a new kit; reviewed risks of used pumps. Provided mom with a hand pump for occ use- showed her how to use & clean it (visitors were now in the room so encouraged mom to try the hand pump later to check flange size). Mom reports no further questions. Encouraged mom to call for help as needed.     Maternal Data Has patient been taught Hand Expression?: Yes  Feeding Feeding Type: Breast Fed Length of feed: 20 min  LATCH Score Latch: Grasps breast easily, tongue down, lips flanged, rhythmical sucking.  Audible Swallowing: A few with stimulation  Type of Nipple: Everted at rest and after stimulation  Comfort (Breast/Nipple): Soft / non-tender  Hold (Positioning): Assistance needed to correctly position infant at breast and maintain latch.  LATCH Score: 8  Interventions Interventions: Breast feeding basics reviewed  Lactation  Tools Discussed/Used     Consult Status Consult Status: Follow-up Date: 03/16/17 Follow-up type: In-patient    LYvonna Alanis9/09/2016, 5:20 PM

## 2017-03-15 NOTE — H&P (Signed)
Emily Mclaughlin is a 31 y.o. female presenting for active labor @ 40.1 wks. OB History    Gravida Para Term Preterm AB Living   2       1     SAB TAB Ectopic Multiple Live Births   1             Past Medical History:  Diagnosis Date  . Acne   . Arthritis   . Contraception   . Lordosis   . Scoliosis    Past Surgical History:  Procedure Laterality Date  . WISDOM TOOTH EXTRACTION     Family History: family history includes Cancer (age of onset: 638) in her mother; Cancer (age of onset: 5460) in her father; Heart disease (age of onset: 3450) in her father. Social History:  reports that she has never smoked. She has never used smokeless tobacco. She reports that she does not drink alcohol or use drugs.     Maternal Diabetes: No Genetic Screening: Normal Maternal Ultrasounds/Referrals: Normal Fetal Ultrasounds or other Referrals:  None Maternal Substance Abuse:  No Significant Maternal Medications:  None Significant Maternal Lab Results:  None Other Comments:  None  Review of Systems  Constitutional: Negative.   HENT: Negative.   Eyes: Negative.   Respiratory: Negative.   Cardiovascular: Negative.   Gastrointestinal: Positive for abdominal pain.  Genitourinary: Negative.   Musculoskeletal: Negative.   Skin: Negative.   Neurological: Negative.   Endo/Heme/Allergies: Negative.   Psychiatric/Behavioral: Negative.    Maternal Medical History:  Reason for admission: Rupture of membranes and contractions.   Contractions: Onset was 6-12 hours ago.   Frequency: regular.   Perceived severity is strong.    Fetal activity: Perceived fetal activity is normal.   Last perceived fetal movement was within the past hour.    Prenatal complications: no prenatal complications Prenatal Complications - Diabetes: none.    Dilation: 10 Effacement (%): 100 Station: +2 Exam by:: S.Faulk, RN Blood pressure (!) 127/52, pulse 98, temperature 97.7 F (36.5 C), temperature source  Axillary, resp. rate 18, height 5\' 5"  (1.651 m), weight 172 lb (78 kg), last menstrual period 06/14/2016, SpO2 97 %. Maternal Exam:  Uterine Assessment: Contraction strength is firm.  Abdomen: Patient reports no abdominal tenderness. Estimated fetal weight is 7.0.   Fetal presentation: vertex  Introitus: Normal vulva. Normal vagina.  Ferning test: positive.  Nitrazine test: not done. Amniotic fluid character: clear.  Cervix: Cervix evaluated by digital exam.     Fetal Exam Fetal Monitor Review: Mode: ultrasound.   Variability: moderate (6-25 bpm).   Pattern: accelerations present and variable decelerations.    Fetal State Assessment: Category I - tracings are normal.     Physical Exam  Constitutional: She is oriented to person, place, and time. She appears well-developed and well-nourished.  HENT:  Head: Normocephalic.  Eyes: Pupils are equal, round, and reactive to light.  Neck: Normal range of motion.  Cardiovascular: Normal rate, regular rhythm and intact distal pulses.   Respiratory: Effort normal and breath sounds normal.  GI: Soft. Bowel sounds are normal.  Genitourinary: Vagina normal and uterus normal.  Musculoskeletal: Normal range of motion.  Neurological: She is oriented to person, place, and time. She has normal reflexes.  Skin: Skin is warm and dry.  Psychiatric: She has a normal mood and affect. Her behavior is normal. Judgment and thought content normal.    Prenatal labs: ABO, Rh: A/POS/-- (01/23 1530) Antibody: NEG (01/23 1530) Rubella: 5.73 (01/23 1530) RPR: NON REAC (  06/18 0751)  HBsAg: NEGATIVE (01/23 1530)  HIV: NONREACTIVE (06/18 0751)  GBS: Negative (08/09 0000)   Assessment/Plan: Active labor GBS neg admit   Wyvonnia Dusky 03/15/2017, 8:04 AM

## 2017-03-16 DIAGNOSIS — Z8759 Personal history of other complications of pregnancy, childbirth and the puerperium: Secondary | ICD-10-CM

## 2017-03-16 LAB — RPR: RPR: NONREACTIVE

## 2017-03-16 MED ORDER — OXYCODONE HCL 5 MG PO TABS: 5.0000 mg | ORAL_TABLET | Freq: Four times a day (QID) | ORAL | 0 refills | Status: DC | PRN

## 2017-03-16 MED ORDER — IBUPROFEN 600 MG PO TABS: 600.0000 mg | ORAL_TABLET | Freq: Four times a day (QID) | ORAL | 0 refills | Status: DC

## 2017-03-16 MED ORDER — SENNOSIDES-DOCUSATE SODIUM 8.6-50 MG PO TABS: 2.0000 | ORAL_TABLET | Freq: Every evening | ORAL | 0 refills | Status: DC | PRN

## 2017-03-16 NOTE — Discharge Summary (Signed)
OB Discharge Summary     Patient Name: Emily Mclaughlin DOB: 1986/03/03 MRN: [id number]  Date of admission: 03/15/2017 Delivering MD: Wyvonnia Dusky D   Date of discharge: 03/16/2017  Admitting diagnosis: 40 WEEKS CTX Intrauterine pregnancy: [redacted]w[redacted]d     Secondary diagnosis:  Active Problems:   Normal labor   Status post vacuum-assisted vaginal delivery  Additional problems:  Patient Active Problem List   Diagnosis Date Noted  . Status post vacuum-assisted vaginal delivery 03/16/2017  . Normal labor 03/15/2017  . Supervision of normal pregnancy in third trimester 08/04/2016  . Family history of breast cancer in mother 04/20/2016  . Acne   . Scoliosis   . Lordosis        Discharge diagnosis: Term Pregnancy Delivered                                                                                                Post partum procedures:N/A  Augmentation: N/A  Complications: vacuum assist, 3rd degree laceration  Hospital course:  Onset of Labor With Vacuum Assisted Vaginal Delivery     31 y.o. yo G2P1011 at [redacted]w[redacted]d was admitted in Active Labor on 03/15/2017. Patient had an uncomplicated labor course as follows:  Membrane Rupture Time/Date: 4:00 AM ,03/15/2017   Intrapartum Procedures: Episiotomy: None [1]                                         Lacerations:  3rd degree [4]  Patient had a delivery of a Viable infant. 03/15/2017  Information for the patient's newborn:  Mclaughlin, Girl Loyd [[id number]]  Delivery Method: Vaginal, Vacuum (Extractor) (Filed from Delivery Summary)    Pateint had an uncomplicated postpartum course.  She is ambulating, tolerating a regular diet, passing flatus, and urinating well. Patient is discharged home in stable condition on 03/16/17.   Physical exam  Vitals:   03/15/17 1105 03/15/17 1346 03/15/17 2100 03/16/17 0531  BP: 107/64 99/63 (!) 112/58 106/66  Pulse: 76 87 80 61  Resp: 18  18 18   Temp: 98.8 F (37.1 C) 98.8 F (37.1 C) 98.6 F (37 C)  97.8 F (36.6 C)  TempSrc:   Oral Oral  SpO2:      Weight:      Height:       General: alert and cooperative Lochia: appropriate Uterine Fundus: firm Incision: N/A DVT Evaluation: No evidence of DVT seen on physical exam. Labs: Lab Results  Component Value Date   WBC 16.5 (H) 03/15/2017   HGB 12.6 03/15/2017   HCT 37.4 03/15/2017   MCV 85.8 03/15/2017   PLT 219 03/15/2017   CMP Latest Ref Rng & Units 08/04/2016  Glucose 65 - 99 mg/dL 72(Z)    Discharge instruction: per After Visit Summary and "Baby and Me Booklet".  After visit meds:  Allergies as of 03/16/2017      Reactions   Sulfa Antibiotics Rash      Medication List    TAKE these medications   ibuprofen 600 MG  tablet Commonly known as:  ADVIL,MOTRIN Take 1 tablet (600 mg total) by mouth every 6 (six) hours.   oxyCODONE 5 MG immediate release tablet Commonly known as:  Oxy IR/ROXICODONE Take 1 tablet (5 mg total) by mouth every 6 (six) hours as needed for severe pain.   prenatal multivitamin Tabs tablet Take 1 tablet by mouth daily at 12 noon.   senna-docusate 8.6-50 MG tablet Commonly known as:  Senokot-S Take 2 tablets by mouth at bedtime as needed for mild constipation.            Discharge Care Instructions        Start     Ordered   03/16/17 0000  ibuprofen (ADVIL,MOTRIN) 600 MG tablet  Every 6 hours     03/16/17 0901   03/16/17 0000  oxyCODONE (OXY IR/ROXICODONE) 5 MG immediate release tablet  Every 6 hours PRN     03/16/17 0901   03/16/17 0000  senna-docusate (SENOKOT-S) 8.6-50 MG tablet  At bedtime PRN     03/16/17 0901   03/15/17 0000  OB RESULT CONSOLE Group B Strep    Comments:  This external order was created through the Results Console.   03/15/17 0753   03/15/17 0000  OB RESULTS CONSOLE GC/Chlamydia    Comments:  This external order was created through the Results Console.   03/15/17 0753      Diet: routine diet  Activity: Advance as tolerated. Pelvic rest for 6 weeks.    Outpatient follow up:6 weeks Follow up Appt:Future Appointments Date Time Provider Department Center  04/27/2017 1:15 PM Allie Bossierove, Myra C, MD [facility]   Follow up Visit:No Follow-up on file.  Postpartum contraception: Progesterone only pills  Newborn Data: Live born female  Birth Weight: 6 lb 15.6 oz (3165 g) APGAR: 9, 9  Baby Feeding: Breast Disposition:home with mother   03/16/2017 Larene BeachMary K Tresten Pantoja, DO PGY-2

## 2017-03-16 NOTE — Discharge Instructions (Signed)

## 2017-03-16 NOTE — Lactation Note (Signed)
This note was copied from a baby's chart. Lactation Consultation Note  Patient Name: Emily Mclaughlin Today's Date: 03/16/2017 Reason for consult: Follow-up assessment;Infant weight loss;Primapara;Nipple pain/trauma  Baby is 3127 hours old and per mom baby cluster fed all night , attempted to feed this am.  Nipples alittle tender, LC  assessed with breast tissue with moms permission and noted both nipples  To be pinky red , and the upper edges of both to have some bruising. LC reviewed basics of latching ,  Breast massage, hand express, pre-pump if needed with hand pump. Latch with firm support.  And not to allow the baby to nibble on to the breast, wait for the wide open mouth and latch with breast  Compressions until  swallows and intermittent. Discussed nutritive vs non- nutritive feeding patterns and the  Importance of watching for hanging out when latched.  @ this consult , checked diaper, dry, placed STS,and baby awake and latched in the cradle and when the lab  Tech was drawing blood for PKU baby released. Let her finish. And switch the baby to a football position on the right breast  And baby opened wide and latched with depth and fed 8 mins with swallows, increased with breast compressions.  LC explained to mom since the baby cluster fed all night that for now the days may be her sluggish time.  Sore nipple and engorgement prevention and tx reviewed. Mom will have a DEBP Medela at home.  Mother informed of post-discharge support and given phone number to the lactation department, including services for phone call assistance; out-patient appointments; and breastfeeding support group. List of other breastfeeding resources in the community given in the handout. Encouraged mother to call for problems or concerns related to breastfeeding.   Maternal Data Has patient been taught Hand Expression?: Yes Does the patient have breastfeeding experience prior to this delivery?:  No  Feeding Feeding Type: Breast Fed Length of feed: 8 min (multiple swallows noted )  LATCH Score Latch: Grasps breast easily, tongue down, lips flanged, rhythmical sucking.  Audible Swallowing: Spontaneous and intermittent  Type of Nipple: Everted at rest and after stimulation  Comfort (Breast/Nipple): Soft / non-tender  Hold (Positioning): Assistance needed to correctly position infant at breast and maintain latch.  LATCH Score: 9  Interventions Interventions: Breast feeding basics reviewed;Shells;Comfort gels;Hand pump  Lactation Tools Discussed/Used Tools: Shells;Pump;Flanges;Comfort gels Flange Size: 27 Shell Type: Inverted Breast pump type: Manual WIC Program: No Pump Review: Setup, frequency, and cleaning Initiated by:: MAI  Date initiated:: 03/16/17   Consult Status Consult Status: Complete Date: 03/16/17 Follow-up type: In-patient    Matilde SprangMargaret Ann Oseph Imburgia 03/16/2017, 11:11 AM

## 2017-03-18 ENCOUNTER — Encounter: Payer: BC Managed Care – PPO | Admitting: Obstetrics & Gynecology

## 2017-04-27 ENCOUNTER — Ambulatory Visit (INDEPENDENT_AMBULATORY_CARE_PROVIDER_SITE_OTHER): Payer: BC Managed Care – PPO | Admitting: Obstetrics & Gynecology

## 2017-04-27 ENCOUNTER — Encounter: Payer: Self-pay | Admitting: Obstetrics & Gynecology

## 2017-04-27 VITALS — BP 111/73 | HR 86 | Resp 16 | Ht 65.0 in | Wt 155.0 lb

## 2017-04-27 DIAGNOSIS — Z1389 Encounter for screening for other disorder: Secondary | ICD-10-CM | POA: Diagnosis not present

## 2017-04-27 DIAGNOSIS — Z0000 Encounter for general adult medical examination without abnormal findings: Secondary | ICD-10-CM

## 2017-04-27 MED ORDER — NORETHINDRONE 0.35 MG PO TABS: 1.0000 | ORAL_TABLET | Freq: Every day | ORAL | 11 refills | Status: DC

## 2017-04-27 NOTE — Progress Notes (Signed)
Post Partum Exam  Emily Mclaughlin is a 31 y.o. G53P1011 female who presents for a postpartum visit. She is 6 weeks postpartum following a spontaneous vaginal delivery with vacuum and 3rd degree perineal  Laceration.  I have fully reviewed the prenatal and intrapartum course. The delivery was at [redacted]w[redacted]d gestational weeks.  Anesthesia: nitrous oxide. Postpartum course has been unremarkable. Baby's course has been unremarkable except for positive PKU Baby is feeding by breast and formula.  The formula Phenex-1 . Bleeding no bleeding. Bowel function is normal. Bladder function is normal. Patient is not sexually active. Contraception method is OCP's. Postpartum depression screening:neg Baby has PKU.  The following portions of the patient's history were reviewed and updated as appropriate: allergies, current medications, past family history, past medical history, past social history, past surgical history and problem list.  Review of Systems Pertinent items are noted in HPI.    Objective:  Resp. rate 16, height  (1.651 m), currently breastfeeding.  General:  alert   Breasts:  inspection negative, no nipple discharge or bleeding, no masses or nodularity palpable  Lungs: clear to auscultation bilaterally  Heart:  regular rate and rhythm, S1, S2 normal, no murmur, click, rub or gallop  Abdomen: soft, non-tender; bowel sounds normal; no masses,  no organomegaly   Vulva:  normal  Vagina: normal vagina  Cervix:  anteverted  Corpus: not examined  Adnexa:  not evaluated  Rectal Exam: Not performed.        Assessment:    Normal postpartum exam. Pap smear not done at today's visit.   Plan:   1. Contraception: oral progesterone-only contraceptive 2.  Discussed the 80% effectiveness of the POPs, same time every day, back up method for 4 weeks 3. Follow up in: 1 year or as needed.  4. Flu vaccine today

## 2017-04-28 DIAGNOSIS — Z23 Encounter for immunization: Secondary | ICD-10-CM

## 2018-03-18 ENCOUNTER — Other Ambulatory Visit: Payer: Self-pay | Admitting: Obstetrics & Gynecology

## 2018-05-02 ENCOUNTER — Encounter: Payer: Self-pay | Admitting: Obstetrics & Gynecology

## 2018-05-02 ENCOUNTER — Ambulatory Visit (INDEPENDENT_AMBULATORY_CARE_PROVIDER_SITE_OTHER): Payer: BC Managed Care – PPO | Admitting: Obstetrics & Gynecology

## 2018-05-02 VITALS — BP 102/64 | HR 75 | Ht 65.0 in | Wt 130.0 lb

## 2018-05-02 DIAGNOSIS — Z1239 Encounter for other screening for malignant neoplasm of breast: Secondary | ICD-10-CM

## 2018-05-02 DIAGNOSIS — R102 Pelvic and perineal pain: Secondary | ICD-10-CM | POA: Diagnosis not present

## 2018-05-02 DIAGNOSIS — Z8349 Family history of other endocrine, nutritional and metabolic diseases: Secondary | ICD-10-CM

## 2018-05-02 DIAGNOSIS — N94819 Vulvodynia, unspecified: Secondary | ICD-10-CM

## 2018-05-02 DIAGNOSIS — Z01419 Encounter for gynecological examination (general) (routine) without abnormal findings: Secondary | ICD-10-CM

## 2018-05-02 MED ORDER — NORETHIN ACE-ETH ESTRAD-FE 1-20 MG-MCG(24) PO TABS: 1.0000 | ORAL_TABLET | Freq: Every day | ORAL | 11 refills | Status: DC

## 2018-05-02 NOTE — Progress Notes (Signed)
Subjective:     Emily Mclaughlin is a 32 y.o. female here for a routine exam.  Current complaints: pain with intercourse, painful bleeding from rectum during an occasional B (none recently).  Pt agrees to be tested for hereditary breast cancer  Gynecologic History No LMP recorded. Contraception: oral progesterone-only contraceptive Last Pap: 2017. Results were: normal Last mammogram: 2018. Results were: abnormal  Obstetric History OB History  Gravida Para Term Preterm AB Living  '2 1 1   1 1  ' SAB TAB Ectopic Multiple Live Births  1     0 1    # Outcome Date GA Lbr Len/2nd Weight Sex Delivery Anes PTL Lv  2 Term 03/15/17 57w1d06:28 / 00:13 6 lb 15.6 oz (3.165 kg) F Vag-Vacuum Local  LIV     Birth Comments: wnl  1 SAB              The following portions of the patient's history were reviewed and updated as appropriate: allergies, current medications, past family history, past medical history, past social history, past surgical history and problem list.  Review of Systems Pertinent items noted in HPI and remainder of comprehensive ROS otherwise negative.    Objective:      Vitals:   05/02/18 1035  BP: 102/64  Pulse: 75  Weight: 130 lb (59 kg)  Height: '5\' 5"'  (1.651 m)   Vitals:  WNL General appearance: alert, cooperative and no distress  HEENT: Normocephalic, without obvious abnormality, atraumatic Eyes: negative Throat: lips, mucosa, and tongue normal; teeth and gums normal  Respiratory: Clear to auscultation bilaterally  CV: Regular rate and rhythm  Breasts:  Normal appearance, no masses or tenderness, no nipple retraction or dimpling  GI: Soft, non-tender; bowel sounds normal; no masses,  no organomegaly  GU: External Genitalia:  Tanner V, no lesion Urethra:  No prolapse   Vagina: Pale pink, normal rugae, no blood or discharge (mildly atrophic); pain between 4 o'clock and 8 o'clock; redness at 5 o'clock; pain   Cervix: No CMT, no lesion  Uterus:  Normal size  and contour, non tender  Adnexa: Normal, no masses, non tender  Musculoskeletal: No edema, redness or tenderness in the calves or thighs  Skin: No lesions or rash  Lymphatic: Axillary adenopathy: none     Psychiatric: Normal mood and behavior    Assessment:    Healthy female exam.   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  Pt will stop if decreases milk supply. Pelvic PT with Emily CountsRTC 8 weeks to me.   In addition to routine exam, 25 extra minutes spent face to face with patient -- counseling regarding hereditary breast cancer screening and vulvodynia.

## 2018-05-02 NOTE — Progress Notes (Signed)
Last pap 04/20/16- normal

## 2018-05-04 ENCOUNTER — Ambulatory Visit: Payer: BC Managed Care – PPO

## 2018-05-05 ENCOUNTER — Ambulatory Visit: Payer: BC Managed Care – PPO

## 2018-05-17 ENCOUNTER — Encounter: Payer: Self-pay | Admitting: *Deleted

## 2018-05-17 NOTE — Addendum Note (Signed)
Addended by: Granville Lewis on: 05/17/2018 10:42 AM   Modules accepted: Orders

## 2018-05-17 NOTE — Progress Notes (Unsigned)
A copy of My Risk report mailed to pt's home address.

## 2018-05-25 ENCOUNTER — Encounter: Payer: Self-pay | Admitting: *Deleted

## 2018-05-30 ENCOUNTER — Other Ambulatory Visit: Payer: Self-pay

## 2018-05-30 ENCOUNTER — Ambulatory Visit: Payer: BC Managed Care – PPO | Attending: Obstetrics & Gynecology | Admitting: Physical Therapy

## 2018-05-30 ENCOUNTER — Encounter: Payer: Self-pay | Admitting: Physical Therapy

## 2018-05-30 DIAGNOSIS — R252 Cramp and spasm: Secondary | ICD-10-CM | POA: Diagnosis not present

## 2018-05-30 DIAGNOSIS — R279 Unspecified lack of coordination: Secondary | ICD-10-CM | POA: Insufficient documentation

## 2018-05-30 DIAGNOSIS — M6281 Muscle weakness (generalized): Secondary | ICD-10-CM | POA: Insufficient documentation

## 2018-05-30 NOTE — Therapy (Signed)
[facility] [address] [place], [place], [address] Phone: [phone]   Fax:  [phone]  Physical Therapy Evaluation  Patient Details  Name: Emily Mclaughlin MRN: [id number] Date of Birth: 1985/11/24 Referring Provider (PT): Lesly Dukes, MD   Encounter Date: 05/30/2018  PT End of Session - 05/30/18 1249    Visit Number  1    Date for PT Re-Evaluation  07/25/18    PT Start Time  1155    PT Stop Time  1235    PT Time Calculation (min)  40 min    Activity Tolerance  Patient tolerated treatment well    Behavior During Therapy  [facility] for tasks assessed/performed       Past Medical History:  Diagnosis Date  . Acne   . Arthritis   . Contraception   . Lordosis   . Scoliosis     Past Surgical History:  Procedure Laterality Date  . WISDOM TOOTH EXTRACTION      There were no vitals filed for this visit.   Subjective Assessment - 05/30/18 1200    Subjective  I haven't had the pain with BM in a while. Most of the time not having pain with BM.  Has pain with intercourse most times.  Pain started about when daughter was born.    Currently in Pain?  No/denies         [facility] PT Assessment - 05/30/18 0001      Assessment   Medical Diagnosis  R10.2 (ICD-10-CM) - Pelvic pain    Referring Provider (PT)  Lesly Dukes, MD    Onset Date/Surgical Date  --   over a year   Prior Therapy  No      Precautions   Precautions  None      Restrictions   Weight Bearing Restrictions  No      Balance Screen   Has the patient fallen in the past 6 months  No      Home Environment   Living Environment  Private residence    Living Arrangements  Spouse/significant other;Children   26 month old     Prior Function   Level of Independence  Independent      Cognition   Overall Cognitive Status  Within Functional Limits for tasks assessed      Posture/Postural Control   Posture/Postural Control  Postural  limitations    Postural Limitations  Increased lumbar lordosis      ROM / Strength   AROM / PROM / Strength  PROM;Strength      PROM   Overall PROM Comments  bilateral hip ER/IR 20% limited      Strength   Overall Strength Comments  Rt hip abduction and adduction 4/5 MMT; Lt hip ext and adduction 4/5MMT      Flexibility   Soft Tissue Assessment /Muscle Length  yes    Hamstrings  10% limited      Palpation   SI assessment   normal    Palpation comment  no diastasis of rectus abdominis      Ambulation/Gait   Gait Pattern  Within Functional Limits                Objective measurements completed on examination: See above findings.    Pelvic Floor Special Questions - 05/30/18 0001    Prior Pelvic/Prostate Exam  Yes    Date of Last Pelvic/Prostate Exam  05/02/18    Prior Pregnancies  Yes  Number of Pregnancies  2   1 miscarriage   Number of Vaginal Deliveries  1    Currently Sexually Active  Yes    Is this Painful  Yes    Marinoff Scale  pain interrupts completion    Urinary Leakage  No    Urinary urgency  Yes    Urinary frequency  2-3 hours    Fecal incontinence  No    Fluid intake  80-100oz    Falling out feeling (prolapse)  No    Skin Integrity  Erthema    Perineal Body/Introitus   Elevated    External Palpation  tender Rt ischiocavernosis    Prolapse  None    Pelvic Floor Internal Exam  pt identity confirmed and consent given to perform internal soft tissue assessemnt    Exam Type  Vaginal    Palpation  scar tissue adhesion posterior vaginal canal,     Strength  weak squeeze, no lift    Strength # of seconds  4    Tone  high       [facility] Adult PT Treatment/Exercise - 05/30/18 0001      Self-Care   Self-Care  Other Self-Care Comments    Other Self-Care Comments   diaphragmatic breathing with bulging and pelvic floor self massage             PT Education - 05/30/18 1228    Education Details   Access Code: [email or web address]        PT Short Term  Goals - 05/30/18 1313      PT SHORT TERM GOAL #1   Title  pt will be ind with initial HEP    Time  4    Period  Weeks    Status  New    Target Date  06/27/18      PT SHORT TERM GOAL #2   Title  Pt will be ind with toileting techniqes    Time  4    Period  Weeks    Status  New    Target Date  06/27/18        PT Long Term Goals - 05/30/18 1309      PT LONG TERM GOAL #1   Title  Pt will report 1/3 on Marinoff scale    Baseline  2/3    Time  8    Period  Weeks    Status  New    Target Date  07/25/18      PT LONG TERM GOAL #2   Title  Pt will be ind with self stretch and massage to be able to manage pain    Time  8    Period  Weeks    Status  New    Target Date  07/25/18      PT LONG TERM GOAL #3   Title  pt will report no pain or discomfort with bowel movements due to improved muscle coordination     Time  8    Period  Weeks    Status  New    Target Date  07/25/18             Plan - 05/30/18 1250    Clinical Impression Statement  Pt presents to skilled PT due to pelvic pain.  Pt states she has had pain with intercourse and occasionally with BM after the birth of her 73 month old.  States pain has improved a little since starting estrogen.  Pt has some LE  weakness and described above.  Pt has some tightness in hasmtrings bilaterally.  Pt has some decreased ROM of hip IR/ER bilaterally.  Pt has muscle spasms and high tone throughout pelvic floor bilaterally with greater tenderness on Rt side.  Pt has weakness of circular contraction.  Pt will benefit from skilled PT to work on skilled PT for pelvic floor muscle downtraining and to address afore mentioned impairments.    History and Personal Factors relevant to plan of care:  vaginal delivery with significant tearing, scoliosis    Clinical Presentation  Stable    Clinical Presentation due to:  pt is stable    Clinical Decision Making  Low    Rehab Potential  Excellent    PT Frequency  1x / week    PT Duration  8  weeks    PT Treatment/Interventions  ADLs/Self Care Home Management;Biofeedback;Cryotherapy;Electrical Stimulation;Moist Heat;Therapeutic activities;Therapeutic exercise;Neuromuscular re-education;Patient/family education;Manual techniques;Passive range of motion;Dry needling;Taping;Scar mobilization    PT Next Visit Plan  toileting techniques, biofeedback downtraining, internal STM, breathing and bulging, sitting on ball    Recommended Other Services  eval 05/30/18    Consulted and Agree with Plan of Care  Patient       Patient will benefit from skilled therapeutic intervention in order to improve the following deficits and impairments:  Pain, Increased fascial restricitons, Decreased scar mobility, Decreased strength  Visit Diagnosis: Cramp and spasm  Muscle weakness (generalized)  Unspecified lack of coordination     Problem List Patient Active Problem List   Diagnosis Date Noted  . Family history of PKU 05/02/2018  . Status post vacuum-assisted vaginal delivery 03/16/2017  . Family history of breast cancer in mother 04/20/2016  . Acne   . Scoliosis   . Lordosis     Vincente PoliJakki Crosser, PT 05/30/2018, 1:38 PM  [facility] [address] [place], [place], [address] Phone: [phone]   Fax:  [phone]  Name: Levy Payne Sw[place] MRN: [id number] Date of Birth: [birth year]

## 2018-05-30 NOTE — Patient Instructions (Signed)
STRETCHING THE PELVIC FLOOR MUSCLES NO DILATOR  Supplies . Vaginal lubricant . Mirror (optional) . Gloves (optional) Positioning . Start in a semi-reclined position with your head propped up. Bend your knees and place your thumb or finger at the vaginal opening. Procedure . Apply a moderate amount of lubricant on the outer skin of your vagina, the labia minora.  Apply additional lubricant to your finger. . Spread the skin away from the vaginal opening. Place the end of your finger at the opening. . Do a maximum contraction of the pelvic floor muscles. Tighten the vagina and the anus maximally and relax. . When you know they are relaxed, gently and slowly insert your finger into your vagina, directing your finger slightly downward, for 2-3 inches of insertion. . Relax and stretch the 6 o'clock position . Hold each stretch for _2 min__ and repeat __1_ time with rest breaks of _1__ seconds between each stretch. . Repeat the stretching in the 4 o'clock and 8 o'clock positions. . Total time should be _6__ minutes, _1__ x per day.  Note the amount of theme your were able to achieve and your tolerance to your finger in your vagina. . Once you have accomplished the techniques you may try them in standing with one foot resting on the tub, or in other positions.  This is a good stretch to do in the shower if you don't need to use lubricant.   

## 2018-06-08 NOTE — Telephone Encounter (Signed)
Tried to call patient to inform of scheduled appointment at [facility] but I get nothing but a busy signal. MyChart message sent as well.

## 2018-06-15 ENCOUNTER — Encounter: Payer: Self-pay | Admitting: *Deleted

## 2018-06-17 ENCOUNTER — Ambulatory Visit: Payer: BC Managed Care – PPO | Attending: Obstetrics & Gynecology | Admitting: Physical Therapy

## 2018-06-17 ENCOUNTER — Encounter: Payer: Self-pay | Admitting: Physical Therapy

## 2018-06-17 DIAGNOSIS — R252 Cramp and spasm: Secondary | ICD-10-CM | POA: Diagnosis present

## 2018-06-17 DIAGNOSIS — M6281 Muscle weakness (generalized): Secondary | ICD-10-CM | POA: Diagnosis present

## 2018-06-17 DIAGNOSIS — R279 Unspecified lack of coordination: Secondary | ICD-10-CM | POA: Diagnosis present

## 2018-06-17 NOTE — Therapy (Addendum)
[facility] [address] [place], [place], [address] Phone: [phone]   Fax:  [phone]  Physical Therapy Treatment  Patient Details  Name: Emily Mclaughlin MRN: [id number] Date of Birth: 07/31/1985 Referring Provider (PT): Leggett, Kelly H, MD   Encounter Date: 06/17/2018  PT End of Session - 06/17/18 1105    Visit Number  2    Date for PT Re-Evaluation  07/25/18    PT Start Time  1102    PT Stop Time  1145    PT Time Calculation (min)  43 min    Activity Tolerance  Patient tolerated treatment well    Behavior During Therapy  WFL for tasks assessed/performed       Past Medical History:  Diagnosis Date  . Acne   . Arthritis   . Contraception   . Lordosis   . Scoliosis     Past Surgical History:  Procedure Laterality Date  . WISDOM TOOTH EXTRACTION      There were no vitals filed for this visit.  Subjective Assessment - 06/17/18 1105    Subjective  I did the self massage and breathing but not sure if I did anything.  It has been about the same, pain with intercourse.  I haven't had the pain with my BMs at all anymore    Currently in Pain?  No/denies                       [facility] Adult PT Treatment/Exercise - 06/17/18 0001      Neuro Re-ed    Neuro Re-ed Details   breathing and bulging throughout stretches as listed below to work on lengthening pelvic floor      Exercises   Exercises  Lumbar      Lumbar Exercises: Stretches   Prone on Elbows Stretch  5 reps;10 seconds    Quadruped Mid Back Stretch  5 reps;10 seconds   cat cow   Piriformis Stretch  Right;Left;1 rep;30 seconds    Figure 4 Stretch  1 rep;30 seconds;Supine;With overpressure    Other Lumbar Stretch Exercise  sidelying thoracic rotation      Manual Therapy   Manual Therapy  Internal Pelvic Floor    Manual therapy comments  identity confirmed and pt given informed consent to perfrom internal soft tissue work    Internal Pelvic Floor  vaginal to levator ani, bulbocavernosis, obdurator internis             PT Education - 06/17/18 1142    Education Details   Access Code: [email or web address] and info to purchase pelvic floor massager    Person(s) Educated  Patient    Methods  Explanation;Demonstration;Verbal cues;Handout    Comprehension  Verbalized understanding;Returned demonstration       PT Short Term Goals - 06/17/18 1148      PT SHORT TERM GOAL #1   Title  pt will be ind with initial HEP    Status  Achieved      PT SHORT TERM GOAL #2   Title  Pt will be ind with toileting techniqes    Status  Achieved        PT Long Term Goals - 06/17/18 1148      PT LONG TERM GOAL #1   Title  Pt will report 1/3 on Marinoff scale    Baseline  pain does not effect completion, but it is a little more than it was prior to this beginning      Status  Achieved      PT LONG TERM GOAL #2   Title  Pt will be ind with self stretch and massage to be able to manage pain    Status  Partially Met      PT LONG TERM GOAL #3   Title  pt will report no pain or discomfort with bowel movements due to improved muscle coordination     Status  Achieved            Plan - 06/17/18 1149    Clinical Impression Statement  Pt has been feeling better and not having pain with BM.  She is still having dyspareunia but it does not effect completion.  Pt may be able to continue on her own with HEP at this time.  She still has some pain with intercourse and would like to keep her episode open for several more weeks in case she has need for further PT to work on this issue. Skilled PT will be beneficial to ensure successful transition to final HEP.    PT Treatment/Interventions  ADLs/Self Care Home Management;Biofeedback;Cryotherapy;Electrical Stimulation;Moist Heat;Therapeutic activities;Therapeutic exercise;Neuromuscular re-education;Patient/family education;Manual techniques;Passive range of motion;Dry needling;Taping;Scar  mobilization    PT Next Visit Plan  biofeedback, internal STM, glute and lumbar STM, sitting on ball breathingand bulging    Recommended Other Services  cert signed    Consulted and Agree with Plan of Care  Patient       Patient will benefit from skilled therapeutic intervention in order to improve the following deficits and impairments:  Pain, Increased fascial restricitons, Decreased scar mobility, Decreased strength  Visit Diagnosis: Cramp and spasm  Muscle weakness (generalized)  Unspecified lack of coordination     Problem List Patient Active Problem List   Diagnosis Date Noted  . Family history of PKU 05/02/2018  . Status post vacuum-assisted vaginal delivery 03/16/2017  . Family history of breast cancer in mother 04/20/2016  . Acne   . Scoliosis   . Lordosis     Zannie Cove, PT 06/17/2018, 12:05 PM  [facility] [address] [place], [place], [address] Phone: [phone]   Fax:  [phone]  Name: Emily Mclaughlin MRN: [id number] Date of Birth: 09/13/1985  PHYSICAL THERAPY DISCHARGE SUMMARY  Visits from Start of Care: 2  Current functional level related to goals / functional outcomes: See above details, met most goals   Remaining deficits: See above   Education / Equipment: HEP  Plan: Patient agrees to discharge.  Patient goals were partially met. Patient is being discharged due to being pleased with the current functional level.  ?????    [employer], PT 08/29/18 11:55 AM

## 2018-06-17 NOTE — Patient Instructions (Addendum)
Access Code: [email or web address]  URL: [email or web address]  Date: 06/17/2018  Prepared by: Dorie RankJacqueline Crosser   Exercises  Supine Diaphragmatic Breathing with Pelvic Floor Lengthening - 10 reps - 1 sets - 3x daily - 7x weekly  Cat-Camel to Child's Pose - 5 reps - 1 sets - 10 sec hold - 1x daily - 7x weekly  Supine Pelvic Floor Stretch - 3 reps - 1 sets - 30 sec hold - 1x daily - 7x weekly  Supine Piriformis Stretch with Foot on Ground - 3 reps - 1 sets - 30 sec hold - 1x daily - 7x weekly  Supine Figure 4 Piriformis Stretch - 3 reps - 1 sets - 30 sec hold - 1x daily - 7x weekly  Prone Press Up on Elbows - 5 reps - 1 sets - 10 sec hold - 1x daily - 7x weekly  Sidelying Thoracic Rotation with Open Book - 5 reps - 1 sets - 10 sec hold - 1x daily - 7x weekly    [email or web address]  Pelvic floor massage

## 2018-06-27 ENCOUNTER — Ambulatory Visit: Payer: BC Managed Care – PPO | Admitting: Obstetrics & Gynecology

## 2018-08-04 ENCOUNTER — Ambulatory Visit: Payer: BC Managed Care – PPO | Admitting: Obstetrics & Gynecology

## 2018-08-12 ENCOUNTER — Other Ambulatory Visit: Payer: Self-pay | Admitting: General Surgery

## 2018-08-15 ENCOUNTER — Ambulatory Visit (INDEPENDENT_AMBULATORY_CARE_PROVIDER_SITE_OTHER): Payer: BC Managed Care – PPO | Admitting: Obstetrics & Gynecology

## 2018-08-15 ENCOUNTER — Encounter: Payer: Self-pay | Admitting: Obstetrics & Gynecology

## 2018-08-15 VITALS — Ht 65.0 in | Wt 133.0 lb

## 2018-08-15 DIAGNOSIS — N941 Unspecified dyspareunia: Secondary | ICD-10-CM

## 2018-08-15 DIAGNOSIS — R52 Pain, unspecified: Secondary | ICD-10-CM | POA: Diagnosis not present

## 2018-08-15 NOTE — Progress Notes (Signed)
   Subjective:    Patient ID: Emily Mclaughlin, female    DOB: 03/28/1986, 33 y.o.   MRN: [id number]  HPI  Pt doing much better after vaginal estrogen and OCPS and had 2 visits of PT.  Intercourse much better but still tender on left side.  Must be gentle during intercourse.    Pt saw Dr. Dwain Sarna for breast cancer q6 months breast surveillance.  Alternating [place] and MRI AFTER cessation of breast feeding.  Also recommended Tamoxifen when done with child bearing.    Pt doing well on OCPS.   Not having menses on 24/4 pill which can be normal.    Review of Systems  Respiratory: Negative.   Cardiovascular: Negative.   Gastrointestinal: Negative.   Genitourinary: Positive for vaginal pain. Negative for menstrual problem.       Objective:   Physical Exam Vitals signs reviewed.  Constitutional:      General: Emily Mclaughlin is not in acute distress.    Appearance: Emily Mclaughlin is well-developed.  HENT:     Head: Normocephalic and atraumatic.  Eyes:     Conjunctiva/sclera: Conjunctivae normal.  Cardiovascular:     Rate and Rhythm: Normal rate.  Pulmonary:     Effort: Pulmonary effort is normal.  Abdominal:     General: Abdomen is flat.     Palpations: Abdomen is soft.     Tenderness: There is no abdominal tenderness.  Genitourinary:    General: Normal vulva.     Vagina: No vaginal discharge.     Comments: Vagina well estrogenized There is still significant muscle tension and pain on the left vaginal wall and at the left vestibular gland. Skin:    General: Skin is warm and dry.  Neurological:     Mental Status: Emily Mclaughlin is alert and oriented to person, place, and time.    Assessment & Plan:  33 year old female with dyspareunia status post traumatic vaginal delivery and lifetime increased risk of breast cancer  1.  We will follow with Dr. Lessie Dings after breast-feeding and begin breast surveillance plan.  Emily Mclaughlin will begin tamoxifen when Emily Mclaughlin is done with childbearing. 2.  Suggest more physical  therapy with [facility].  I believe that with more exercises and manual manipulation Emily Mclaughlin can become more comfortable.  We will send my note to Eulis Foster at the end of the day.  25 minutes spent face-to-face with patient with greater than 50% counseling.

## 2018-08-15 NOTE — Progress Notes (Signed)
Pt states pelvic pain has improved

## 2018-09-27 ENCOUNTER — Encounter: Payer: Self-pay | Admitting: *Deleted

## 2019-06-12 ENCOUNTER — Encounter: Payer: Self-pay | Admitting: Obstetrics & Gynecology

## 2019-06-12 ENCOUNTER — Ambulatory Visit (INDEPENDENT_AMBULATORY_CARE_PROVIDER_SITE_OTHER): Payer: BC Managed Care – PPO | Admitting: Obstetrics & Gynecology

## 2019-06-12 ENCOUNTER — Other Ambulatory Visit: Payer: Self-pay

## 2019-06-12 VITALS — BP 111/78 | HR 98 | Resp 16 | Ht 65.0 in | Wt 138.0 lb

## 2019-06-12 DIAGNOSIS — Z803 Family history of malignant neoplasm of breast: Secondary | ICD-10-CM

## 2019-06-12 DIAGNOSIS — Z01419 Encounter for gynecological examination (general) (routine) without abnormal findings: Secondary | ICD-10-CM

## 2019-06-12 DIAGNOSIS — Z124 Encounter for screening for malignant neoplasm of cervix: Secondary | ICD-10-CM

## 2019-06-12 DIAGNOSIS — Z1151 Encounter for screening for human papillomavirus (HPV): Secondary | ICD-10-CM | POA: Diagnosis not present

## 2019-06-12 DIAGNOSIS — N94819 Vulvodynia, unspecified: Secondary | ICD-10-CM

## 2019-06-12 DIAGNOSIS — R102 Pelvic and perineal pain: Secondary | ICD-10-CM

## 2019-06-12 MED ORDER — LIDOCAINE 5 % EX OINT: 1.0000 "application " | TOPICAL_OINTMENT | CUTANEOUS | 1 refills | Status: DC | PRN

## 2019-06-12 MED ORDER — NORETHIN ACE-ETH ESTRAD-FE 1-20 MG-MCG(24) PO TABS: 1.0000 | ORAL_TABLET | Freq: Every day | ORAL | 11 refills | Status: DC

## 2019-06-12 NOTE — Patient Instructions (Signed)
Call matt wakefield to follow up on increased breast cancer risk. [facility]  [name] for PT

## 2019-06-12 NOTE — Progress Notes (Signed)
Subjective:     Emily Mclaughlin is a 33 y.o. female here for a routine exam.  Current complaints: continued dyspareunia.  Pt did go to physical therapy.  She was uncomfortable participating in the care.  She is not used to discussing her sex with providers and she was reluctant to continue care.  We discussed at length today the importance of treating the dyspareunia with physical therapy as it is the least invasive treatment we have.  Medications would not work well alone given her increased muscle tension.  Patient would like to try an new practitioner as she did not bond with her first practitioner.  I sent a note to Emily Mclaughlin who has agreed to see her.  Pt has finished breast feeding and is to go back to Dr. Donne Hazel for increased lifetime risk of breast cancer.  She will make an appt.     Gynecologic History Patient's last menstrual period was 05/24/2019. Contraception: OCP (estrogen/progesterone) Last Pap: 2017. Results were: normal Last mammogram: 2017. Results were: normal  Obstetric History OB History  Gravida Para Term Preterm AB Living  2 1 1   1 1   SAB TAB Ectopic Multiple Live Births  1     0 1    # Outcome Date GA Lbr Len/2nd Weight Sex Delivery Anes PTL Lv  2 Term 03/15/17 [redacted]w[redacted]d 06:28 / 00:13 6 lb 15.6 oz (3.165 kg) F Vag-Vacuum Local  LIV     Birth Comments: wnl  1 SAB             The following portions of the patient's history were reviewed and updated as appropriate: allergies, current medications, past family history, past medical history, past social history, past surgical history and problem list.  Review of Systems Pertinent items noted in HPI and remainder of comprehensive ROS otherwise negative.    Objective:      Vitals:   06/12/19 0830  BP: 111/78  Pulse: 98  Resp: 16  Weight: 138 lb (62.6 kg)  Height: 5\' 5"  (1.651 m)   Vitals:  WNL General appearance: alert, cooperative and no distress  HEENT: Normocephalic, without obvious abnormality,  atraumatic Eyes: negative Throat: lips, mucosa, and tongue normal; teeth and gums normal  Respiratory: Clear to auscultation bilaterally  CV: Regular rate and rhythm  Breasts:  Normal appearance, no masses or tenderness, no nipple retraction or dimpling  GI: Soft, non-tender; bowel sounds normal; no masses,  no organomegaly  GU: External Genitalia:  Tanner V, no lesion Urethra:  No prolapse  Vulva: increased pain to light touch from skene's to skene's (2 o'clock -10 o'clock)  Vagina: Pink, normal rugae, no blood or discharge; increased tone at introitus and increased pain bilateral pelvic walls with L>R  Cervix: No CMT, no lesion  Uterus:  Normal size and contour, non tender  Adnexa: Normal, no masses, non tender  Musculoskeletal: No edema, redness or tenderness in the calves or thighs  Skin: No lesions or rash  Lymphatic: Axillary adenopathy: none     Psychiatric: Normal mood and behavior    Assessment:    Healthy female exam.   Vulvodynia Pelvic floor dysfunction   Plan:    Pap smear with co testing  Referral back to Emily Mclaughlin for PT.  Suggest counseling to explore resistance to talking and receiving care.  Lidocaine for pain with intercourse.  Pt to message me after PT.  Biofeedback could be beneficial.    Mammogram and Pt to f/u with Dr. Donne Hazel.

## 2019-06-13 LAB — CYTOLOGY - PAP
Comment: NEGATIVE
Diagnosis: NEGATIVE
High risk HPV: NEGATIVE

## 2019-06-22 ENCOUNTER — Ambulatory Visit: Payer: BC Managed Care – PPO | Admitting: Physical Therapy

## 2019-06-30 ENCOUNTER — Ambulatory Visit: Payer: BC Managed Care – PPO | Admitting: Physical Therapy

## 2019-07-18 ENCOUNTER — Other Ambulatory Visit: Payer: Self-pay | Admitting: *Deleted

## 2019-07-18 DIAGNOSIS — Z803 Family history of malignant neoplasm of breast: Secondary | ICD-10-CM

## 2019-07-20 ENCOUNTER — Ambulatory Visit: Payer: BC Managed Care – PPO | Admitting: Physical Therapy

## 2019-07-26 ENCOUNTER — Other Ambulatory Visit: Payer: Self-pay | Admitting: Obstetrics & Gynecology

## 2019-07-26 ENCOUNTER — Other Ambulatory Visit: Payer: Self-pay

## 2019-07-26 ENCOUNTER — Ambulatory Visit (INDEPENDENT_AMBULATORY_CARE_PROVIDER_SITE_OTHER): Payer: BC Managed Care – PPO

## 2019-07-26 DIAGNOSIS — Z803 Family history of malignant neoplasm of breast: Secondary | ICD-10-CM

## 2019-08-01 ENCOUNTER — Other Ambulatory Visit: Payer: Self-pay

## 2019-08-01 ENCOUNTER — Encounter: Payer: Self-pay | Admitting: Physical Therapy

## 2019-08-01 ENCOUNTER — Ambulatory Visit: Payer: BC Managed Care – PPO | Attending: Obstetrics & Gynecology | Admitting: Physical Therapy

## 2019-08-01 DIAGNOSIS — R252 Cramp and spasm: Secondary | ICD-10-CM

## 2019-08-01 DIAGNOSIS — M6281 Muscle weakness (generalized): Secondary | ICD-10-CM | POA: Diagnosis present

## 2019-08-01 DIAGNOSIS — R279 Unspecified lack of coordination: Secondary | ICD-10-CM

## 2019-08-01 NOTE — Therapy (Signed)
[facility] [address], [place], [place], [address] Phone: [phone]   Fax:  [phone]  Physical Therapy Evaluation  Patient Details  Name: Emily Mclaughlin MRN: [id number] Date of Birth: 06/12/1986 Referring Provider (PT): Dr. Silas Sacramento   Encounter Date: 08/01/2019  PT End of Session - 08/01/19 1507    Visit Number  1    Date for PT Re-Evaluation  10/24/19    PT Start Time  1400    PT Stop Time  1450    PT Time Calculation (min)  50 min    Activity Tolerance  Patient tolerated treatment well;No increased pain    Behavior During Therapy  WFL for tasks assessed/performed       Past Medical History:  Diagnosis Date  . Acne   . Arthritis   . Contraception   . Lordosis   . Scoliosis     Past Surgical History:  Procedure Laterality Date  . WISDOM TOOTH EXTRACTION      There were no vitals filed for this visit.   Subjective Assessment - 08/01/19 1408    Subjective  Patient started pain with 03/15/2017. Patient pushed 2 times and she was born. Patient had a 3rd degree tear. No urinary leakage. 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    Patient Stated Goals  no pain with intercourse    Currently in Pain?  Yes    Pain Score  6     Pain Location  Vagina   initial penetration   Pain Orientation  Mid    Pain Descriptors / Indicators  --   pinching   Pain Type  Chronic pain    Pain Onset  More than a month ago    Pain Frequency  Intermittent    Aggravating Factors   intercourse, vaginal exam, sitting    Pain Relieving Factors  no intercourse    Multiple Pain Sites  No         [facility] PT Assessment - 08/01/19 0001      Assessment   Medical Diagnosis  N94.819 Vulvodynia: R10.2 Pelvic Pain    Referring Provider (PT)  Dr. Silas [place]    Onset Date/Surgical Date  03/15/17    Prior Therapy  none      Precautions    Precautions  None      Restrictions   Weight Bearing Restrictions  No      Balance Screen   Has the patient fallen in the past 6 months  No    Has the patient had a decrease in activity level because of a fear of falling?   No    Is the patient reluctant to leave their home because of a fear of falling?   No      Home Film/video editor residence      Prior Function   Level of Independence  Independent    Vocation  Full time employment    Vocation Requirements  sitting to work    Leisure  walks her daughter      Cognition   Overall Cognitive Status  Within Functional Limits for tasks assessed      Posture/Postural Control   Posture/Postural Control  Postural limitations    Posture Comments  curve with hips to left      ROM / Strength   AROM / PROM / Strength  AROM;PROM;Strength  AROM   Overall AROM Comments  lumbar ROM is limited by 25%      PROM   Right Hip External Rotation   60    Left Hip External Rotation   70      Strength   Right Hip External Rotation   4/5    Right Hip ABduction  3+/5    Left Hip Extension  4/5    Left Hip External Rotation  4/5    Left Hip ABduction  3+/5      Palpation   Palpation comment  tightness in the diaphragm and midline abdomen, left gluteus medius                Objective measurements completed on examination: See above findings.    Pelvic Floor Special Questions - 08/01/19 0001    Prior Pregnancies  Yes    Number of Pregnancies  2    Number of Vaginal Deliveries  1   2 pushes, 3rd degree tear   Diastasis Recti  none    Currently Sexually Active  Yes    Is this Painful  Yes    Marinoff Scale  discomfort that does not affect completion    Urinary Leakage  No    Urinary urgency  No    Fecal incontinence  No    Falling out feeling (prolapse)  No    Pelvic Floor Internal Exam  Patient confirms identification and approves PT to assess the pelvic floor and treatment    Exam Type  Vaginal     Palpation  tenderness located on bil. obturator internist and levator ani, 3rd degree healed tear is tender, increased thickness of the left pelvic floor muscles    Strength  weak squeeze, no lift               PT Education - 08/01/19 1510    Education Details  educated patient on how to perform perineal massage where her scar was; education on the pelvic floor muscles and perineal tears with birth and bone landmarks    Person(s) Educated  Patient    Methods  Explanation;Demonstration    Comprehension  Verbalized understanding;Returned demonstration   on model      PT Short Term Goals - 08/01/19 1721      PT SHORT TERM GOAL #1   Title  pt will be ind with initial HEP    Time  4    Period  Weeks    Target Date  08/29/19      PT SHORT TERM GOAL #2   Title  understand how to perform perineal massage and scar massage to elongate the tissue and desensitize the area    Time  4    Period  Weeks    Status  New    Target Date  08/29/19      PT SHORT TERM GOAL #3   Title  understand diaphragmatic breathing to elongate the pelvic floor and relax with penile penetration    Time  4    Period  Weeks    Status  New    Target Date  08/29/19        PT Long Term Goals - 08/01/19 1723      PT LONG TERM GOAL #1   Title  independent with advanced HEP    Baseline  --    Time  12    Period  Weeks    Status  New    Target Date  10/24/19  PT LONG TERM GOAL #2   Title  pain with penile penetration decreased >/= 75% so pain level is </= 1/10    Time  12    Period  Weeks    Status  New    Target Date  10/24/19      PT LONG TERM GOAL #3   Title  able to sit without pain in the vaginal area due to elongation of tissue and improvement of vaginal health    Time  8    Period  Weeks    Status  New    Target Date  09/26/19      PT LONG TERM GOAL #4   Title  understand correct lubricants and vaginal moisturizers to use for good vaginal health    Time  12    Period  Weeks     Status  New    Target Date  10/24/19             Plan - 08/01/19 1512    Clinical Impression Statement  Patient is a 34 year old female with pelvic pain with penile penetration since she had her daughter vaginally. Patient had a third degree tear from the birth. Patient reports pain with penile penetration at the entrance at level 6/10 with marinoff score 1/3. Patient reports she has pain with vaginal exams. Patient pelvic floor strength is 2/5. Palpable tenderness located internally on sides of introitus,  bilateral levator ani and obturator internist with increased muscle mass on the left. Patient has increased redness and dryness on the vulva and labia. Patient has weakness in the hip abductors. Patient has decreased mobility of diaphragm and tightness in the abdomen. Patient has scoliosis and tightness in the hip rotators. Patient will benefit from skilled therapy to improve elongation of the vaginal tissue to have vaginal penetration.    Personal Factors and Comorbidities  Comorbidity 1;Time since onset of injury/illness/exacerbation;Behavior Pattern    Comorbidities  vaginal birth with 3rd degree tear    Examination-Activity Limitations  Sit    Examination-Participation Restrictions  Interpersonal Relationship    Stability/Clinical Decision Making  Evolving/Moderate complexity    Clinical Decision Making  Moderate    Rehab Potential  Excellent    PT Frequency  1x / week   may come every other week   PT Duration  12 weeks    PT Treatment/Interventions  Biofeedback;Cryotherapy;Electrical Stimulation;Moist Heat;Ultrasound;Neuromuscular re-education;Therapeutic exercise;Therapeutic activities;Patient/family education;Manual techniques;Dry needling;Passive range of motion;Scar mobilization;Spinal Manipulations    PT Next Visit Plan  educate on vaginal moisturizers like v-magic, go over lubricants, educated on self massage technique,    Consulted and Agree with Plan of Care  Patient        Patient will benefit from skilled therapeutic intervention in order to improve the following deficits and impairments:  Decreased coordination, Decreased range of motion, Increased fascial restricitons, Increased muscle spasms, Decreased activity tolerance, Pain, Decreased scar mobility, Decreased mobility, Decreased strength  Visit Diagnosis: Cramp and spasm - Plan: PT plan of care cert/re-cert  Muscle weakness (generalized) - Plan: PT plan of care cert/re-cert  Unspecified lack of coordination - Plan: PT plan of care cert/re-cert     Problem List Patient Active Problem List   Diagnosis Date Noted  . Family history of PKU 05/02/2018  . Status post vacuum-assisted vaginal delivery 03/16/2017  . Family history of breast cancer in mother 04/20/2016  . Acne   . Scoliosis   . Lordosis     Eulis Foster, PT 08/01/19 5:28  PM   [facility] [address], [address] [place], [place], [address] Phone: [phone]   Fax:  [phone]  Name: Enrique Payne [place] MRN: [id number] Date of Birth: 1986/03/06

## 2019-08-17 ENCOUNTER — Other Ambulatory Visit: Payer: Self-pay

## 2019-08-17 ENCOUNTER — Encounter: Payer: Self-pay | Admitting: Physical Therapy

## 2019-08-17 ENCOUNTER — Ambulatory Visit: Payer: BC Managed Care – PPO | Attending: Obstetrics & Gynecology | Admitting: Physical Therapy

## 2019-08-17 DIAGNOSIS — M6281 Muscle weakness (generalized): Secondary | ICD-10-CM | POA: Diagnosis present

## 2019-08-17 DIAGNOSIS — R279 Unspecified lack of coordination: Secondary | ICD-10-CM | POA: Diagnosis present

## 2019-08-17 DIAGNOSIS — R252 Cramp and spasm: Secondary | ICD-10-CM | POA: Insufficient documentation

## 2019-08-17 NOTE — Patient Instructions (Addendum)
Moisturizers . Ingredients to avoid is glycerin and fragrance, can increase chance of infection . Use daily      Creams to use externally on the Vulva area   V-magic cream - amazon  Vital "V Wild Yam salve ( help moisturize and help with thinning vulvar area, does have Beeswax  [employer] Pro-Meno Wild Yam Cream- Amazon  Desert Harvest Gele  Cleo by Zane Herald labial moisturizer (Amazon,   Coconut or olive oil  aloe   Things to avoid in the vaginal area . Do not use things to irritate the vulvar area . No lotions just specialized creams for the vulva area- Neogyn, V-magic, No soaps; can use Aveeno or Calendula cleanser if needed. Must be gentle . No deodorants . No douches . Good to sleep without underwear to let the vaginal area to air out . No scrubbing: spread the lips to let warm water rinse over labias and pat dry   Lubrication . Used for intercourse to reduce friction . Avoid ones that have glycerin, warming gels, tingling gels, icing or cooling gel, scented . Avoid parabens due to a preservative similar to female sex hormone . May need to be reapplied once or several times during sexual activity . Can be applied to both partners genitals prior to vaginal penetration to minimize friction or irritation . Prevent irritation and mucosal tears that cause post coital pain and increased the risk of vaginal and urinary tract infections . Oil-based lubricants cannot be used with condoms due to breaking them down.  Least likely to irritate vaginal tissue.  . Plant based-lubes are safe . Silicone-based lubrication are thicker and last long and used for post-menopausal women   Start with thumb in vaginal canal  sweep from 6 O'Clock to 1 O'Clock 10 each side Thumb placed into vaginal canal 1 inch and index finger between anus and vagina and twirl  Like massaging in web space of thumb Area between anus and vagina outside you will press circle release for 2  minutes   Vaginal Lubricators Here is a list of some suggested lubricators you can use for intercourse. Use the most hypoallergenic product.  You can place on you or your partner.   Slippery Stuff ( water based)  Sylk or Sliquid Natural H2O ( good  if frequent UTI's)- [employer]mart, amazon  Sliquid organics silk-(aloe and silicone based )  [employer] (www.blossom-organics.com)- (aloe based )  Coconut oil, olive oil -not good with condoms   PJur Woman Nude- (water based) amazon  Uberlube- ( silicon) Amazon  Aloe Vera- Sprouts has an organic one  Yes lubricant- (water based and has plant oil based similar to silicone) [employer] Platinum-Silicone, Target, [employer]  Olive and Bee intimate cream-  www.oliveandbee.com.au  Pink - WellPoint stuff  Erosense Sync- [employer], Sim Boast Things to avoid in lubricants are glycerin, warming gels, tingling gels, icing or cooling  gels, and scented gels.  Also avoid Vaseline. KY jelly, Replens, and Astroglide kills good bacteria(lactobacilli)  Start to put your finger into the vaginal canal 1 inch Tighten your pelvic floor with a hug and lift 5 times then hold for 5 sec 5 times  [facility] [address] [place], [place] [address] Phone # [phone] Fax [phone]

## 2019-08-17 NOTE — Therapy (Signed)
[facility] [address] [place], [place], [address] Phone: [phone]   Fax:  [phone]  Physical Therapy Treatment  Patient Details  Name: Emily Mclaughlin [place] MRN: [id number] Date of Birth: [birth year] Referring Provider (PT): Dr. Elsie Lincoln   Encounter Date: 08/17/2019  PT End of Session - 08/17/19 1005    Visit Number  2    Date for PT Re-Evaluation  10/24/19    Authorization Type  [employer]    PT Start Time  0930    PT Stop Time  1000   patient had enough info   PT Time Calculation (min)  30 min    Activity Tolerance  Patient tolerated treatment well;No increased pain    Behavior During Therapy  WFL for tasks assessed/performed       Past Medical History:  Diagnosis Date  . Acne   . Arthritis   . Contraception   . Lordosis   . Scoliosis     Past Surgical History:  Procedure Laterality Date  . WISDOM TOOTH EXTRACTION      There were no vitals filed for this visit.  Subjective Assessment - 08/17/19 0934    Subjective  I have my period today.    Patient Stated Goals  no pain with intercourse    Currently in Pain?  Yes    Pain Score  6     Pain Location  Vagina   penile penetration   Pain Orientation  Mid    Pain Descriptors / Indicators  --   pinching   Pain Type  Chronic pain    Pain Onset  More than a month ago    Pain Frequency  Intermittent    Aggravating Factors   intercourse, vaginal exam, sitting    Pain Relieving Factors  no intercourse                       [facility] Adult PT Treatment/Exercise - 08/17/19 0001      Self-Care   Self-Care  Other Self-Care Comments    Other Self-Care Comments   instructed pateint on vaginal lubricants, vaginal moisturizers, self perineal massage, vaginal health and how to tell she is doing a pelvic floor contraction with exercise using the pelvic floor model             PT Education - 08/17/19 1008    Education Details   education on perineal massage, vaginal health, vaginal lubricants and moisturizers, pelvic floor contraction    Person(s) Educated  Patient    Methods  Explanation;Demonstration;Handout    Comprehension  Verbalized understanding;Returned demonstration       PT Short Term Goals - 08/01/19 1721      PT SHORT TERM GOAL #1   Title  pt will be ind with initial HEP    Time  4    Period  Weeks    Target Date  08/29/19      PT SHORT TERM GOAL #2   Title  understand how to perform perineal massage and scar massage to elongate the tissue and desensitize the area    Time  4    Period  Weeks    Status  New    Target Date  08/29/19      PT SHORT TERM GOAL #3   Title  understand diaphragmatic breathing to elongate the pelvic floor and relax with penile penetration    Time  4    Period  Weeks  Status  New    Target Date  08/29/19        PT Long Term Goals - 08/01/19 1723      PT LONG TERM GOAL #1   Title  independent with advanced HEP    Baseline  --    Time  12    Period  Weeks    Status  New    Target Date  10/24/19      PT LONG TERM GOAL #2   Title  pain with penile penetration decreased >/= 75% so pain level is </= 1/10    Time  12    Period  Weeks    Status  New    Target Date  10/24/19      PT LONG TERM GOAL #3   Title  able to sit without pain in the vaginal area due to elongation of tissue and improvement of vaginal health    Time  8    Period  Weeks    Status  New    Target Date  09/26/19      PT LONG TERM GOAL #4   Title  understand correct lubricants and vaginal moisturizers to use for good vaginal health    Time  12    Period  Weeks    Status  New    Target Date  10/24/19            Plan - 08/17/19 1008    Clinical Impression Statement  Patient was on her cycle so no internal work was done. Patient was educated on perineal massage and scar massage on pelvic floor model and she returned demonstration. Patient was educated on lubricants and vaginal  moisturizers for intercourse and vaginal health. Patient was educated on pelvic floor exercise with using the therapist finger on her finger so she is able to do at home. Patient will benefit from skilled therapy to imporve elongation of the vaginal tissue to have vaginal penetration.    Personal Factors and Comorbidities  Comorbidity 1;Time since onset of injury/illness/exacerbation;Behavior Pattern    Comorbidities  vaginal birth with 3rd degree tear    Examination-Activity Limitations  Sit    Examination-Participation Restrictions  Interpersonal Relationship    Stability/Clinical Decision Making  Evolving/Moderate complexity    Rehab Potential  Excellent    PT Frequency  1x / week   may come every other week   PT Duration  12 weeks    PT Treatment/Interventions  Biofeedback;Cryotherapy;Electrical Stimulation;Moist Heat;Ultrasound;Neuromuscular re-education;Therapeutic exercise;Therapeutic activities;Patient/family education;Manual techniques;Dry needling;Passive range of motion;Scar mobilization;Spinal Manipulations    PT Next Visit Plan  internal soft tissue work, bulging of the pelvic floor, diaphragmatic breathing, hip rotator stretch    Recommended Other Services  MD signed intial note    Consulted and Agree with Plan of Care  Patient       Patient will benefit from skilled therapeutic intervention in order to improve the following deficits and impairments:  Decreased coordination, Decreased range of motion, Increased fascial restricitons, Increased muscle spasms, Decreased activity tolerance, Pain, Decreased scar mobility, Decreased mobility, Decreased strength  Visit Diagnosis: Cramp and spasm  Muscle weakness (generalized)  Unspecified lack of coordination     Problem List Patient Active Problem List   Diagnosis Date Noted  . Family history of PKU 05/02/2018  . Status post vacuum-assisted vaginal delivery 03/16/2017  . Family history of breast cancer in mother 04/20/2016   . Acne   . Scoliosis   . Lordosis  [name], PT 08/17/19 10:14 AM   [facility] [address], [place], [place], [address] Phone: [phone]   Fax:  [phone]  Name: Aariyana Mclaughlin [place] MRN: [id number] Date of Birth: 24-Aug-1985

## 2019-08-30 ENCOUNTER — Ambulatory Visit: Payer: BC Managed Care – PPO | Admitting: Physical Therapy

## 2019-09-14 ENCOUNTER — Encounter: Payer: BC Managed Care – PPO | Admitting: Physical Therapy

## 2019-09-28 ENCOUNTER — Ambulatory Visit: Payer: BC Managed Care – PPO | Attending: Obstetrics & Gynecology | Admitting: Physical Therapy

## 2019-09-28 ENCOUNTER — Other Ambulatory Visit: Payer: Self-pay

## 2019-09-28 ENCOUNTER — Encounter: Payer: Self-pay | Admitting: Physical Therapy

## 2019-09-28 DIAGNOSIS — R252 Cramp and spasm: Secondary | ICD-10-CM | POA: Diagnosis present

## 2019-09-28 DIAGNOSIS — R279 Unspecified lack of coordination: Secondary | ICD-10-CM

## 2019-09-28 DIAGNOSIS — M6281 Muscle weakness (generalized): Secondary | ICD-10-CM

## 2019-09-28 NOTE — Therapy (Signed)
[facility] [address] [place], [place], [address] Phone: [phone]   Fax:  [phone]  Physical Therapy Treatment  Patient Details  Name: Emily Mclaughlin MRN: [id number] Date of Birth: Nov 14, 1985 Referring Provider (PT): Dr. Elsie Lincoln   Encounter Date: 09/28/2019  PT End of Session - 09/28/19 0940    Visit Number  3    Date for PT Re-Evaluation  10/24/19    Authorization Type  BCBS    PT Start Time  0930    PT Stop Time  1010    PT Time Calculation (min)  40 min    Activity Tolerance  Patient tolerated treatment well;No increased pain    Behavior During Therapy  WFL for tasks assessed/performed       Past Medical History:  Diagnosis Date  . Acne   . Arthritis   . Contraception   . Lordosis   . Scoliosis     Past Surgical History:  Procedure Laterality Date  . WISDOM TOOTH EXTRACTION      There were no vitals filed for this visit.  Subjective Assessment - 09/28/19 0934    Subjective  I have been doing the HEP most day. Things are getting easier. I got the V-magic. Had intercourse and not change yet. NO discomfort with sitting.    Patient Stated Goals  no pain with intercourse    Currently in Pain?  Yes    Pain Score  6     Pain Location  Vagina   penile penetration   Pain Orientation  Mid    Pain Descriptors / Indicators  --   Pinching   Pain Type  Chronic pain    Pain Onset  More than a month ago    Pain Frequency  Intermittent    Aggravating Factors   intercourse, vaginal exam    Pain Relieving Factors  no intercourse                    Pelvic Floor Special Questions - 09/28/19 0001    Pelvic Floor Internal Exam  Patient confirms identification and approves PT to assess the pelvic floor and treatment    Exam Type  Vaginal    Strength  weak squeeze, no lift        [facility] Adult PT Treatment/Exercise - 09/28/19 0001      Self-Care   Self-Care  Other Self-Care  Comments    Other Self-Care Comments   education on vaginal moisturizers, using deep breathing to relax with intercourse, foreplay with vaginal massage      Lumbar Exercises: Stretches   Active Hamstring Stretch  Right;Left;2 reps;30 seconds    Active Hamstring Stretch Limitations  sitting    Prone Mid Back Stretch  1 rep;30 seconds    Piriformis Stretch  Right;Left;1 rep;30 seconds    Piriformis Stretch Limitations  sitting    Other Lumbar Stretch Exercise  squat stretch against the wall for 30 seconds with breathing to relax the pelvic floor    Other Lumbar Stretch Exercise  happy baby for 1 minute; sitting hip adductor stretche      Lumbar Exercises: Quadruped   [place]cat/Old Horse  10 reps      Manual Therapy   Manual Therapy  Soft tissue mobilization;Internal Pelvic Floor    Soft tissue mobilization  soft tissue work to bilateral hip adductors    Internal Pelvic Floor  soft tissue work to perineal body wiht education to patient on  how to perform the twirl on the perineal body             PT Education - 09/28/19 1011    Education Details  Access Code: [email or web address]; education on foreplay and reviewed about moisturizers    Person(s) Educated  Patient    Methods  Explanation;Demonstration;Verbal cues;Handout    Comprehension  Verbalized understanding;Returned demonstration       PT Short Term Goals - 09/28/19 1017      PT SHORT TERM GOAL #1   Title  pt will be ind with initial HEP    Time  4    Period  Weeks    Status  Achieved      PT SHORT TERM GOAL #2   Title  understand how to perform perineal massage and scar massage to elongate the tissue and desensitize the area    Time  4    Period  Weeks    Status  Achieved      PT SHORT TERM GOAL #3   Title  understand diaphragmatic breathing to elongate the pelvic floor and relax with penile penetration    Time  4    Period  Weeks    Status  On-going        PT Long Term Goals - 08/01/19 1723      PT LONG TERM GOAL #1    Title  independent with advanced HEP    Baseline  --    Time  12    Period  Weeks    Status  New    Target Date  10/24/19      PT LONG TERM GOAL #2   Title  pain with penile penetration decreased >/= 75% so pain level is </= 1/10    Time  12    Period  Weeks    Status  New    Target Date  10/24/19      PT LONG TERM GOAL #3   Title  able to sit without pain in the vaginal area due to elongation of tissue and improvement of vaginal health    Time  8    Period  Weeks    Status  New    Target Date  09/26/19      PT LONG TERM GOAL #4   Title  understand correct lubricants and vaginal moisturizers to use for good vaginal health    Time  12    Period  Weeks    Status  New    Target Date  10/24/19            Plan - 09/28/19 0940    Clinical Impression Statement  Patient is feeling more comfortable with her self soft tissue work to the perineal body. Patient pelvic floor strength is 2/5. Patient has tight right hip adductor. Patient is being consistent with her HEP. Patient is using the V-Magic cream for the redness in the vulvar and labia area. Patient will benefit from skilled therapy to improve elongation of the vaginal tissue to have vaginal penetration.    Personal Factors and Comorbidities  Comorbidity 1;Time since onset of injury/illness/exacerbation;Behavior Pattern    Comorbidities  vaginal birth with 3rd degree tear    Examination-Activity Limitations  Sit    Examination-Participation Restrictions  Interpersonal Relationship    Stability/Clinical Decision Making  Evolving/Moderate complexity    Rehab Potential  Excellent    PT Frequency  1x / week   may come every other week   PT Duration  12 weeks    PT Treatment/Interventions  Biofeedback;Cryotherapy;Electrical Stimulation;Moist Heat;Ultrasound;Neuromuscular re-education;Therapeutic exercise;Therapeutic activities;Patient/family education;Manual techniques;Dry needling;Passive range of motion;Scar  mobilization;Spinal Manipulations    PT Next Visit Plan  internal soft tissue work, bulging of the pelvic floor, diaphragmatic breathing; work on diaphragm, hip abductor strength    Consulted and Agree with Plan of Care  Patient       Patient will benefit from skilled therapeutic intervention in order to improve the following deficits and impairments:  Decreased coordination, Decreased range of motion, Increased fascial restricitons, Increased muscle spasms, Decreased activity tolerance, Pain, Decreased scar mobility, Decreased mobility, Decreased strength  Visit Diagnosis: Cramp and spasm  Muscle weakness (generalized)  Unspecified lack of coordination     Problem List Patient Active Problem List   Diagnosis Date Noted  . Family history of PKU 05/02/2018  . Status post vacuum-assisted vaginal delivery 03/16/2017  . Family history of breast cancer in mother 04/20/2016  . Acne   . Scoliosis   . Lordosis     [name], PT 09/28/19 10:19 AM   [facility] [address], [place], [place], [address] Phone: [phone]   Fax:  [phone]  Name: Emily Mclaughlin MRN: [id number] Date of Birth: [birth year]

## 2019-09-28 NOTE — Patient Instructions (Signed)
Access Code: [email or web address] URL: [email or web address] Date: 09/28/2019 Prepared by: Eulis Foster  Exercises Deep Squat with Pelvic Floor Relaxation - 1 x daily - 7 x weekly - 1 sets - 1 reps - 30 sec to 1 min hold Seated Piriformis Stretch with Trunk Bend - 1 x daily - 7 x weekly - 1 sets - 1 reps - 30 sec hold Seated Hamstring Stretch - 1 x daily - 7 x weekly - 1 sets - 1 reps - 30 sec hold Supine Pelvic Floor Stretch - 1 x daily - 7 x weekly - 1 sets - 1 reps - 1 min hold Child's Pose Stretch - 1 x daily - 7 x weekly - 1 sets - 1 reps - 30 sec hold Cat-Camel - 1 x daily - 7 x weekly - 1 sets - 10 reps V Sit Hip Adductor Hamstring Stretch - 1 x daily - 7 x weekly - 1 sets - 1 reps - 30 sec hold [facility] [address] [place], [place] [address] Phone # [phone] Fax [phone]

## 2019-10-12 ENCOUNTER — Other Ambulatory Visit: Payer: Self-pay

## 2019-10-12 ENCOUNTER — Encounter: Payer: Self-pay | Admitting: Physical Therapy

## 2019-10-12 ENCOUNTER — Ambulatory Visit: Payer: BC Managed Care – PPO | Attending: Obstetrics & Gynecology | Admitting: Physical Therapy

## 2019-10-12 DIAGNOSIS — M6281 Muscle weakness (generalized): Secondary | ICD-10-CM | POA: Diagnosis present

## 2019-10-12 DIAGNOSIS — R252 Cramp and spasm: Secondary | ICD-10-CM | POA: Insufficient documentation

## 2019-10-12 DIAGNOSIS — R279 Unspecified lack of coordination: Secondary | ICD-10-CM | POA: Diagnosis present

## 2019-10-12 NOTE — Patient Instructions (Addendum)
[email or web address]  [email or web address]   [facility] [address], [address] [place], [place] [address] Phone # [phone] Fax [phone]

## 2019-10-12 NOTE — Therapy (Signed)
[facility] [address] [place], [place], [address] Phone: [phone]   Fax:  [phone]  Physical Therapy Treatment  Patient Details  Name: Emily Mclaughlin MRN: [id number] Date of Birth: 12-16-1985 Referring Provider (PT): Dr. Elsie Lincoln   Encounter Date: 10/12/2019  PT End of Session - 10/12/19 1013    Visit Number  4    Date for PT Re-Evaluation  01/23/20    Authorization Type  BCBS    PT Start Time  0930    PT Stop Time  1010    PT Time Calculation (min)  40 min    Activity Tolerance  Patient tolerated treatment well;No increased pain    Behavior During Therapy  WFL for tasks assessed/performed       Past Medical History:  Diagnosis Date  . Acne   . Arthritis   . Contraception   . Lordosis   . Scoliosis     Past Surgical History:  Procedure Laterality Date  . WISDOM TOOTH EXTRACTION      There were no vitals filed for this visit.  Subjective Assessment - 10/12/19 0933    Subjective  I still feel twinges. Pain on entry. I am feeling more comfortable with soft tissue work to myself.    Patient Stated Goals  no pain with intercourse    Currently in Pain?  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 Elsie Lincoln    Onset Date/Surgical Date  03/15/17    Prior Therapy  none      Precautions   Precautions  None      Restrictions   Weight Bearing Restrictions  No      Home Environment   Living Environment  Private residence      Prior  Function   Level of Independence  Independent    Vocation  Full time employment    Vocation Requirements  sitting to work    Leisure  walks her daughter      Cognition   Overall Cognitive Status  Within Functional Limits for tasks assessed                Pelvic Floor Special Questions - 10/12/19 0001    Number of Pregnancies  2    Number of Vaginal Deliveries  1   2 pushes, 3rd degree tear   Diastasis Recti  none    Currently Sexually Active  Yes    Is this Painful  Yes    Marinoff Scale  discomfort that does not affect completion    Urinary Leakage  No    Urinary urgency  No    Fecal incontinence  No    Falling out feeling (prolapse)  No    Pelvic Floor Internal Exam  Patient confirms identification and approves PT to assess  the pelvic floor and treatment    Exam Type  Vaginal    Strength  weak squeeze, no lift        [facility] Adult PT Treatment/Exercise - 10/12/19 0001      Self-Care   Self-Care  Other Self-Care Comments    Other Self-Care Comments   education on dilators to expand the vaginal canal, discussed about vaginal lubricants and using the v magic      Lumbar Exercises: Stretches   Active Hamstring Stretch  Right;Left;2 reps;30 seconds    Active Hamstring Stretch Limitations  supine    Piriformis Stretch  Right;Left;2 reps;30 seconds    Piriformis Stretch Limitations  supine    Other Lumbar Stretch Exercise  happy baby for 1 minute; sitting hip adductor stretche      Manual Therapy   Manual Therapy  Internal Pelvic Floor    Internal Pelvic Floor  internal gentle soft tissue work to the perineal body, along the posterior fourchette; myofascial release to the perineal body and along the pubic rami and levator ani             PT Education - 10/12/19 1013    Education Details  education on different dilators she can purchase to elongate the entrance of the vaginal canal    Person(s) Educated  Patient    Methods  Explanation;Handout     Comprehension  Verbalized understanding;Returned demonstration       PT Short Term Goals - 10/12/19 1014      PT SHORT TERM GOAL #3   Title  understand diaphragmatic breathing to elongate the pelvic floor and relax with penile penetration    Time  4    Period  Weeks    Status  Achieved        PT Long Term Goals - 10/12/19 1014      PT LONG TERM GOAL #1   Title  independent with advanced HEP    Time  12    Period  Weeks    Status  On-going      PT LONG TERM GOAL #2   Title  pain with penile penetration decreased >/= 75% so pain level is </= 1/10    Time  12    Period  Weeks    Status  On-going      PT LONG TERM GOAL #3   Title  able to sit without pain in the vaginal area due to elongation of tissue and improvement of vaginal health    Time  8    Period  Weeks    Status  On-going      PT LONG TERM GOAL #4   Title  understand correct lubricants and vaginal moisturizers to use for good vaginal health    Time  12    Period  Weeks    Status  Achieved            Plan - 10/12/19 1015    Clinical Impression Statement  Patient feels more relaxed with intercourse. She still has pain with penile penetration at the entrance at pain level 7/10. Patient has tightness in the posterior fourchette and perineal body and levator ani. Patient is using v-magic for vaginal moisturizer and understands to use lubricant with intercourse. Patient was educated today on using dilators and where to puchase them and the different types that are available. Patient will benefit from a dilator to expand the entrance of the vaginal canal for penile penetration and elongate the scar tissue  from her tear. Patient is doing well with her home program and feels more comfortable to perform the soft tissue work on herself. Patient will benefit from skilled therapy to to improve elongation fo the vaginal tissue to have vaginal penetration.    Personal Factors and Comorbidities  Comorbidity 1;Time since onset  of injury/illness/exacerbation;Behavior Pattern    Comorbidities  vaginal birth with 3rd degree tear    Examination-Activity Limitations  Sit    Examination-Participation Restrictions  Interpersonal Relationship    Stability/Clinical Decision Making  Evolving/Moderate complexity    PT Frequency  1x / week   may come every other week   PT Duration  12 weeks    PT Treatment/Interventions  Biofeedback;Cryotherapy;Electrical Stimulation;Moist Heat;Ultrasound;Neuromuscular re-education;Therapeutic exercise;Therapeutic activities;Patient/family education;Manual techniques;Dry needling;Passive range of motion;Scar mobilization;Spinal Manipulations    PT Next Visit Plan  work on perineal body and entrance posterior, see if patient has the dilators and instruct; hip abductor strength    Consulted and Agree with Plan of Care  Patient       Patient will benefit from skilled therapeutic intervention in order to improve the following deficits and impairments:  Decreased coordination, Decreased range of motion, Increased fascial restricitons, Increased muscle spasms, Decreased activity tolerance, Pain, Decreased scar mobility, Decreased mobility, Decreased strength  Visit Diagnosis: Cramp and spasm - Plan: PT plan of care cert/re-cert  Muscle weakness (generalized) - Plan: PT plan of care cert/re-cert  Unspecified lack of coordination - Plan: PT plan of care cert/re-cert     Problem List Patient Active Problem List   Diagnosis Date Noted  . Family history of PKU 05/02/2018  . Status post vacuum-assisted vaginal delivery 03/16/2017  . Family history of breast cancer in mother 04/20/2016  . Acne   . Scoliosis   . Lordosis     Eulis Foster, PT 10/12/19 10:21 AM   [facility] [address], [address] [place], [place], [address] Phone: [phone]   Fax:  [phone]  Name: Emily Mclaughlin MRN: [id number] Date of Birth:  07-21-1985

## 2019-10-25 ENCOUNTER — Encounter: Payer: Self-pay | Admitting: Physical Therapy

## 2019-10-25 ENCOUNTER — Ambulatory Visit: Payer: BC Managed Care – PPO | Admitting: Physical Therapy

## 2019-10-25 ENCOUNTER — Other Ambulatory Visit: Payer: Self-pay

## 2019-10-25 DIAGNOSIS — R252 Cramp and spasm: Secondary | ICD-10-CM

## 2019-10-25 DIAGNOSIS — R279 Unspecified lack of coordination: Secondary | ICD-10-CM

## 2019-10-25 DIAGNOSIS — M6281 Muscle weakness (generalized): Secondary | ICD-10-CM

## 2019-10-25 NOTE — Therapy (Signed)
[facility] [address], [place], [place], [address] Phone: [phone]   Fax:  [phone]  Physical Therapy Treatment  Patient Details  Name: Emily Mclaughlin MRN: [id number] Date of Birth: 1986-01-02 Referring Provider (PT): Dr. Silas [place]   Encounter Date: 10/25/2019  PT End of Session - 10/25/19 0934    Visit Number  5    Date for PT Re-Evaluation  01/23/20    Authorization Type  BCBS    PT Start Time  7591   went to bathroom   PT Stop Time  1013    PT Time Calculation (min)  39 min    Activity Tolerance  Patient tolerated treatment well;No increased pain    Behavior During Therapy  WFL for tasks assessed/performed       Past Medical History:  Diagnosis Date  . Acne   . Arthritis   . Contraception   . Lordosis   . Scoliosis     Past Surgical History:  Procedure Laterality Date  . WISDOM TOOTH EXTRACTION      There were no vitals filed for this visit.  Subjective Assessment - 10/25/19 0936    Subjective  Past couple of times with intercourse I have had slight pain.    Patient Stated Goals  no pain with intercourse    Currently in Pain?  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 Silas [place]    Onset Date/Surgical Date  03/15/17    Prior Therapy  none      Precautions   Precautions  None      Restrictions   Weight Bearing Restrictions  No      Home Environment   Living Environment  Private residence      Prior Function   Level of  Independence  Independent    Vocation  Full time employment    Vocation Requirements  sitting to work    Leisure  walks her daughter      Cognition   Overall Cognitive Status  Within Functional Limits for tasks assessed      Posture/Postural Control   Posture/Postural Control  No significant limitations      ROM / Strength   AROM / PROM / Strength  AROM;PROM;Strength      PROM   Right Hip External Rotation   65    Left Hip External Rotation   70      Strength   Right Hip External Rotation   5/5    Right Hip ABduction  5/5    Left Hip Extension  5/5    Left Hip External Rotation  5/5    Left Hip ABduction  5/5                Pelvic Floor Special Questions - 10/25/19 0001  Pelvic Floor Internal Exam  Patient confirms identification and approves PT to assess the pelvic floor and treatment    Exam Type  Vaginal    Strength  weak squeeze, no lift   now able to contract the anterior and posterior portion    Strength # of seconds  10        [facility] Adult PT Treatment/Exercise - 10/25/19 0001      Neuro Re-ed    Neuro Re-ed Details   pelvic floor contraction with       Manual Therapy   Manual Therapy  Internal Pelvic Floor    Internal Pelvic Floor  internal gentle soft tissue work to the perineal body, along the posterior fourchette; myofascial release to the perineal body and along the pubic rami and levator ani             PT Education - 10/25/19 1008    Education Details  continue with the pelvic floor exercises, continue with the perineal body massage 1 time weekly    Person(s) Educated  Patient    Methods  Explanation;Demonstration    Comprehension  Verbalized understanding;Returned demonstration       PT Short Term Goals - 10/25/19 1009      PT SHORT TERM GOAL #1   Title  pt will be ind with initial HEP    Time  4    Period  Weeks    Status  Achieved      PT SHORT TERM GOAL #2   Title  understand how to perform perineal massage and scar  massage to elongate the tissue and desensitize the area    Time  4    Period  Weeks    Status  Achieved      PT SHORT TERM GOAL #3   Title  understand diaphragmatic breathing to elongate the pelvic floor and relax with penile penetration    Time  4    Period  Weeks    Status  Achieved        PT Long Term Goals - 10/25/19 1010      PT LONG TERM GOAL #1   Title  independent with advanced HEP    Time  12    Period  Weeks    Status  Achieved      PT LONG TERM GOAL #2   Title  pain with penile penetration decreased >/= 75% so pain level is </= 1/10    Time  12    Period  Weeks    Status  Achieved      PT LONG TERM GOAL #3   Title  able to sit without pain in the vaginal area due to elongation of tissue and improvement of vaginal health    Time  8    Period  Weeks    Status  Achieved      PT LONG TERM GOAL #4   Title  understand correct lubricants and vaginal moisturizers to use for good vaginal health    Time  12    Period  Weeks    Status  Achieved            Plan - 10/25/19 0935    Clinical Impression Statement  Patient is able to have penile penetration at no more than 1/10 pain level at the insertion. Patient pelvic floor strength is 2/5 with circular contraction holding for 10 seconds. Patient bilateral hip strength is 5/5. Patient did not get dilators due to her improving  well without them. Patient is using V-Magic for vaginal health and to help the vulva area. Patient is independent with her HEP.    Personal Factors and Comorbidities  Comorbidity 1;Time since onset of injury/illness/exacerbation;Behavior Pattern    Comorbidities  vaginal birth with 3rd degree tear    Examination-Activity Limitations  Sit    Examination-Participation Restrictions  Interpersonal Relationship    Stability/Clinical Decision Making  Evolving/Moderate complexity    Rehab Potential  Excellent    PT Frequency  1x / week   may come every other week   PT Duration  12 weeks    PT  Treatment/Interventions  Biofeedback;Cryotherapy;Electrical Stimulation;Moist Heat;Ultrasound;Neuromuscular re-education;Therapeutic exercise;Therapeutic activities;Patient/family education;Manual techniques;Dry needling;Passive range of motion;Scar mobilization;Spinal Manipulations    PT Next Visit Plan  discharge to current HEP    Consulted and Agree with Plan of Care  Patient       Patient will benefit from skilled therapeutic intervention in order to improve the following deficits and impairments:  Decreased coordination, Decreased range of motion, Increased fascial restricitons, Increased muscle spasms, Decreased activity tolerance, Pain, Decreased scar mobility, Decreased mobility, Decreased strength  Visit Diagnosis: Cramp and spasm  Muscle weakness (generalized)  Unspecified lack of coordination     Problem List Patient Active Problem List   Diagnosis Date Noted  . Family history of PKU 05/02/2018  . Status post vacuum-assisted vaginal delivery 03/16/2017  . Family history of breast cancer in mother 04/20/2016  . Acne   . Scoliosis   . Lordosis     [name], PT 10/25/19 10:13 AM   [facility] [address], [place], [place], [address] Phone: [phone]   Fax:  [phone]  Name: Emily Mclaughlin MRN: [id number] Date of Birth: 06/16/1986  PHYSICAL THERAPY DISCHARGE SUMMARY  Visits from Start of Care: 5  Current functional level related to goals / functional outcomes: See above.    Remaining deficits: See above.    Education / Equipment: HEP Plan: Patient agrees to discharge.  Patient goals were met. Patient is being discharged due to meeting the stated rehab goals. Thank you for the referral. [name], PT 10/25/19 10:14 AM   ?????

## 2019-11-16 ENCOUNTER — Ambulatory Visit: Payer: BC Managed Care – PPO | Admitting: Physical Therapy

## 2019-11-30 ENCOUNTER — Encounter: Payer: BC Managed Care – PPO | Admitting: Physical Therapy

## 2019-12-21 ENCOUNTER — Encounter: Payer: BC Managed Care – PPO | Admitting: Physical Therapy

## 2020-01-04 ENCOUNTER — Encounter: Payer: BC Managed Care – PPO | Admitting: Physical Therapy

## 2020-01-25 ENCOUNTER — Ambulatory Visit: Payer: BC Managed Care – PPO | Admitting: Obstetrics and Gynecology

## 2020-03-04 ENCOUNTER — Ambulatory Visit: Payer: BC Managed Care – PPO | Admitting: Family Medicine

## 2020-03-04 ENCOUNTER — Other Ambulatory Visit: Payer: Self-pay

## 2020-03-04 ENCOUNTER — Encounter: Payer: Self-pay | Admitting: Family Medicine

## 2020-03-04 VITALS — BP 106/68 | HR 103 | Resp 16 | Ht 65.0 in | Wt 141.0 lb

## 2020-03-04 DIAGNOSIS — N926 Irregular menstruation, unspecified: Secondary | ICD-10-CM

## 2020-03-04 DIAGNOSIS — Z803 Family history of malignant neoplasm of breast: Secondary | ICD-10-CM | POA: Diagnosis not present

## 2020-03-04 LAB — TSH: TSH: 2.81 mIU/L

## 2020-03-04 LAB — FOLLICLE STIMULATING HORMONE: FSH: 17.7 m[IU]/mL

## 2020-03-04 NOTE — Assessment & Plan Note (Signed)
Will order MRI, discuss possible poor coverage by her insurance.

## 2020-03-04 NOTE — Progress Notes (Signed)
Subjective:    Patient ID: Emily Mclaughlin is a 34 y.o. female presenting with Metrorrhagia  on 03/04/2020  HPI: May 4th COVID vaccine 2nd dose. Cycles were previously normal. Then went to 40 days in June and then was 26 days and 24 days. Interested in having another baby soon.  According to ovulation prediction kits she did not have ovulation in the last 2 cycles.  She has not no family history of early menopause because most of her family have passed age.  She stopped her birth control in February.  She has taken once daily.  Last time she attempted pregnancy for husband had to get on some form of supplementation to help his sperm count.  Her first baby has PKU.  She has has a strong family history of cancer including her mom who died of breast cancer at age 7.  She had a normal mammogram in January of this year and is supposed to get an MR at the 80-month interval however this has not been ordered yet.  Review of Systems  Constitutional: Negative for chills and fever.  Respiratory: Negative for shortness of breath.   Cardiovascular: Negative for chest pain.  Gastrointestinal: Negative for abdominal pain, nausea and vomiting.  Genitourinary: Negative for dysuria.  Skin: Negative for rash.      Objective:    BP 106/68   Pulse (!) 103   Resp 16   Ht 5\' 5"  (1.651 m)   Wt 141 lb (64 kg)   LMP 03/02/2020   Breastfeeding No   BMI 23.46 kg/m  Physical Exam Constitutional:      General: She is not in acute distress.    Appearance: She is well-developed.  HENT:     Head: Normocephalic and atraumatic.  Eyes:     General: No scleral icterus. Cardiovascular:     Rate and Rhythm: Normal rate.  Pulmonary:     Effort: Pulmonary effort is normal.  Abdominal:     Palpations: Abdomen is soft.  Musculoskeletal:     Cervical back: Neck supple.  Skin:    General: Skin is warm and dry.  Neurological:     Mental Status: She is alert and oriented to person, place, and time.          Assessment & Plan:   Problem List Items Addressed This Visit      Unprioritized   Family history of breast cancer in mother    Will order MRI, discuss possible poor coverage by her insurance.      Relevant Orders   MR BREAST BILATERAL W CONTRAST   Irregular menses - Primary    Suspect this is just related to Covid and the stress of that her body try to get back on track.  Will check TSH and FSH today and then give time for it to reset.  Continue ovulation prediction kits, continue prenatal vitamins.  Should her cycles not become more regular or ovulation not become evident in the next 2 to 3 months would consider an [facility] plus or minus infertility referral.  Also advised that her husband call back to whomever treated him prior to her last pregnancy about possibly getting back on his meds.      Relevant Orders   Follicle stimulating hormone   TSH      Total time in review of prior notes, pathology, labs, history taking, review with patient, exam, note writing, discussion of options, plan for next steps, alternatives and risks of treatment: 35  minutes.  Return in about 3 months (around 06/04/2020) for a follow-up.  Reva Bores 03/04/2020 10:47 AM

## 2020-03-04 NOTE — Assessment & Plan Note (Signed)
Suspect this is just related to Covid and the stress of that her body try to get back on track.  Will check TSH and FSH today and then give time for it to reset.  Continue ovulation prediction kits, continue prenatal vitamins.  Should her cycles not become more regular or ovulation not become evident in the next 2 to 3 months would consider an [facility] plus or minus infertility referral.  Also advised that her husband call back to whomever treated him prior to her last pregnancy about possibly getting back on his meds.

## 2020-03-04 NOTE — Progress Notes (Signed)
Pt c/o irregular periods for the past 2 months

## 2020-04-28 ENCOUNTER — Other Ambulatory Visit: Payer: Self-pay

## 2020-04-28 ENCOUNTER — Ambulatory Visit
Admission: RE | Admit: 2020-04-28 | Discharge: 2020-04-28 | Disposition: A | Discharge status: Home / Self Care | Payer: BC Managed Care – PPO | Source: Ambulatory Visit | Attending: Family Medicine | Admitting: Family Medicine

## 2020-04-28 DIAGNOSIS — Z803 Family history of malignant neoplasm of breast: Secondary | ICD-10-CM

## 2020-04-28 MED ORDER — GADOBUTROL 1 MMOL/ML IV SOLN
6.0000 mL | Freq: Once | INTRAVENOUS | Status: AC | PRN
  Administered 2020-04-28: 6 mL via INTRAVENOUS

## 2020-05-17 ENCOUNTER — Other Ambulatory Visit: Payer: Self-pay | Admitting: *Deleted

## 2020-05-17 DIAGNOSIS — N951 Menopausal and female climacteric states: Secondary | ICD-10-CM

## 2020-05-17 NOTE — Progress Notes (Unsigned)
Orders placed for [facility] and [facility]

## 2020-05-23 LAB — FOLLICLE STIMULATING HORMONE: FSH: 11.4 m[IU]/mL

## 2020-05-23 LAB — ANTI-MULLERIAN HORMONE (AMH), FEMALE: Anti-Mullerian Hormones(AMH), Female: 0.46 ng/mL (ref 0.18–5.68)

## 2020-06-10 ENCOUNTER — Encounter: Payer: Self-pay | Admitting: Obstetrics & Gynecology

## 2020-06-10 ENCOUNTER — Telehealth (INDEPENDENT_AMBULATORY_CARE_PROVIDER_SITE_OTHER): Payer: BC Managed Care – PPO | Admitting: Obstetrics & Gynecology

## 2020-06-10 DIAGNOSIS — Z8349 Family history of other endocrine, nutritional and metabolic diseases: Secondary | ICD-10-CM | POA: Diagnosis not present

## 2020-06-10 DIAGNOSIS — N979 Female infertility, unspecified: Secondary | ICD-10-CM | POA: Diagnosis not present

## 2020-06-10 DIAGNOSIS — Z319 Encounter for procreative management, unspecified: Secondary | ICD-10-CM

## 2020-06-10 MED ORDER — LETROZOLE 2.5 MG PO TABS: ORAL_TABLET | ORAL | 0 refills | Status: DC

## 2020-06-10 NOTE — Progress Notes (Signed)
TELEHEALTH GYNECOLOGY VISIT ENCOUNTER NOTE  I connected with Emily Mclaughlin on 06/10/20 at  3:00 PM EST by telephone at home and verified that I am speaking with the correct person using two identifiers.  Patient was located at home and I was located at [facility]   I discussed the limitations, risks, security and privacy concerns of performing an evaluation and management service by telephone and the availability of in person appointments. I also discussed with the patient that there may be a patient responsible charge related to this service. The patient expressed understanding and agreed to proceed.   History:  Emily Mclaughlin is a 34 y.o. G59P1011 female being evaluated today for infertility.     Past Medical History:  Diagnosis Date   Acne    Arthritis    Contraception    Lordosis    Scoliosis    Past Surgical History:  Procedure Laterality Date   WISDOM TOOTH EXTRACTION     The following portions of the patient's history were reviewed and updated as appropriate: allergies, current medications, past family history, past medical history, past social history, past surgical history and problem list.    Review of Systems:  Pertinent items noted in HPI and remainder of comprehensive ROS otherwise negative.  Physical Exam:   General:  Alert, oriented and cooperative.   Mental Status: Normal mood and affect perceived. Normal judgment and thought content.  Physical exam deferred due to nature of the encounter  Labs and Imaging No results found for this or any previous visit (from the past 336 hour(s)). No results found.    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  It took them 2 years to become pregnant.  There were some abnormalities on the husband semen analysis.  He took the series of vitamins suggested and then they conceived several months later.  Their daughter has PKU.   Patient struggles with whether to have a baby that could be at risk for PKU or to adopt.  She also struggles with using in for fertility treatments to select a child who does not have PKU.  She does not want her first child to feel excluded and be the only one in the family who has PKU.  She is discussed this with multiple family members and friends and no she struggles with this decision.  Patient would like to try very minimally invasive options for getting pregnant.  I suggested a semen analysis to make sure that nothing has changed.  The husband has been taking the regimen of vitamins for several months so the semen analysis should be maximized already.  They have been trying to conceive since March.  Her cycles are now 25 days.  We discussed timed intercourse starting on day 9 and proceeding every other day through day 13-15.  We also discussed Femara.  Patient is on day one of her cycle today and will begin Femara 2.5 mg on days 3 through 7 of her cycle.  Patient will have her husband do the semen analysis after this ovulation cycle given that he needs to abstain 3 days but no longer than 7 before providing the specimen.  Pt should take her MVI for folic acid.       I discussed the assessment and treatment plan with the patient. The patient was provided an opportunity to ask questions and all were answered. The patient agreed with the  plan and demonstrated an understanding of the instructions.   I provided 35 minutes of non-face-to-face time during this encounter which included review of her records, the virtual visit, and documentation.   Elsie Lincoln, MD [facility] [employer], [facility]

## 2020-07-10 ENCOUNTER — Other Ambulatory Visit: Payer: Self-pay | Admitting: *Deleted

## 2020-07-10 MED ORDER — LETROZOLE 2.5 MG PO TABS: ORAL_TABLET | ORAL | 0 refills | Status: DC

## 2020-08-01 ENCOUNTER — Other Ambulatory Visit: Payer: Self-pay | Admitting: *Deleted

## 2020-08-01 MED ORDER — LETROZOLE 2.5 MG PO TABS: ORAL_TABLET | ORAL | 0 refills | Status: DC

## 2020-08-26 ENCOUNTER — Other Ambulatory Visit: Payer: Self-pay | Admitting: Obstetrics & Gynecology

## 2020-08-26 MED ORDER — LETROZOLE 2.5 MG PO TABS: ORAL_TABLET | ORAL | 1 refills | Status: AC

## 2020-08-26 NOTE — Progress Notes (Signed)
Increased Femara to 5 mg days 3-7.  Semen analysis this week.  Offered referral to [facility].

## 2020-09-12 ENCOUNTER — Other Ambulatory Visit: Payer: Self-pay | Admitting: Obstetrics & Gynecology

## 2020-11-04 ENCOUNTER — Encounter: Payer: BC Managed Care – PPO | Admitting: Obstetrics & Gynecology

## 2022-06-23 ENCOUNTER — Other Ambulatory Visit: Payer: Self-pay | Admitting: Advanced Practice Midwife

## 2022-06-23 DIAGNOSIS — N644 Mastodynia: Secondary | ICD-10-CM

## 2022-08-19 ENCOUNTER — Ambulatory Visit: Payer: Self-pay

## 2022-08-19 ENCOUNTER — Ambulatory Visit
Admission: RE | Admit: 2022-08-19 | Discharge: 2022-08-19 | Disposition: A | Discharge status: Home / Self Care | Payer: BC Managed Care – PPO | Source: Ambulatory Visit | Attending: Advanced Practice Midwife | Admitting: Advanced Practice Midwife

## 2022-08-19 DIAGNOSIS — N644 Mastodynia: Secondary | ICD-10-CM

## 2023-09-24 ENCOUNTER — Other Ambulatory Visit: Payer: Self-pay | Admitting: Obstetrics and Gynecology

## 2023-09-24 DIAGNOSIS — Z1231 Encounter for screening mammogram for malignant neoplasm of breast: Secondary | ICD-10-CM

## 2023-09-28 ENCOUNTER — Ambulatory Visit
Admission: RE | Admit: 2023-09-28 | Discharge: 2023-09-28 | Disposition: A | Discharge status: Home / Self Care | Source: Ambulatory Visit | Attending: Obstetrics and Gynecology | Admitting: Obstetrics and Gynecology

## 2023-09-28 DIAGNOSIS — Z1231 Encounter for screening mammogram for malignant neoplasm of breast: Secondary | ICD-10-CM

## 2024-06-05 ENCOUNTER — Other Ambulatory Visit: Payer: Self-pay | Admitting: Adult Health Nurse Practitioner

## 2024-06-05 DIAGNOSIS — Z1231 Encounter for screening mammogram for malignant neoplasm of breast: Secondary | ICD-10-CM

## 2024-09-29 ENCOUNTER — Ambulatory Visit
